# Patient Record
Sex: Male | Born: 1935 | Race: White | Hispanic: No | Smoking: Former smoker
Health system: Southern US, Community
[De-identification: ages and names within clinical notes are randomized; demographics above are authoritative.]

## PROBLEM LIST (undated history)

## (undated) DIAGNOSIS — E785 Hyperlipidemia, unspecified: Secondary | ICD-10-CM

## (undated) DIAGNOSIS — J189 Pneumonia, unspecified organism: Secondary | ICD-10-CM

## (undated) DIAGNOSIS — M1990 Unspecified osteoarthritis, unspecified site: Secondary | ICD-10-CM

## (undated) DIAGNOSIS — M545 Low back pain, unspecified: Secondary | ICD-10-CM

## (undated) DIAGNOSIS — N400 Enlarged prostate without lower urinary tract symptoms: Secondary | ICD-10-CM

## (undated) DIAGNOSIS — Z860100 Personal history of colon polyps, unspecified: Secondary | ICD-10-CM

## (undated) DIAGNOSIS — K219 Gastro-esophageal reflux disease without esophagitis: Secondary | ICD-10-CM

## (undated) DIAGNOSIS — M353 Polymyalgia rheumatica: Secondary | ICD-10-CM

## (undated) DIAGNOSIS — I10 Essential (primary) hypertension: Secondary | ICD-10-CM

## (undated) DIAGNOSIS — I471 Supraventricular tachycardia, unspecified: Secondary | ICD-10-CM

## (undated) DIAGNOSIS — I739 Peripheral vascular disease, unspecified: Secondary | ICD-10-CM

## (undated) HISTORY — DX: Peripheral vascular disease, unspecified: I73.9

## (undated) HISTORY — PX: CHOLECYSTECTOMY: SHX55

## (undated) HISTORY — DX: Personal history of colonic polyps: Z86.010

## (undated) HISTORY — PX: HERNIA REPAIR: SHX51

## (undated) HISTORY — DX: Supraventricular tachycardia: I47.1

## (undated) HISTORY — DX: Gastro-esophageal reflux disease without esophagitis: K21.9

## (undated) HISTORY — DX: Supraventricular tachycardia, unspecified: I47.10

## (undated) HISTORY — DX: Personal history of colon polyps, unspecified: Z86.0100

## (undated) HISTORY — DX: Benign prostatic hyperplasia without lower urinary tract symptoms: N40.0

## (undated) HISTORY — DX: Polymyalgia rheumatica: M35.3

## (undated) HISTORY — DX: Essential (primary) hypertension: I10

## (undated) HISTORY — DX: Hyperlipidemia, unspecified: E78.5

## (undated) HISTORY — DX: Low back pain: M54.5

## (undated) HISTORY — DX: Low back pain, unspecified: M54.50

---

## 1999-06-26 DIAGNOSIS — J189 Pneumonia, unspecified organism: Secondary | ICD-10-CM

## 1999-06-26 HISTORY — DX: Pneumonia, unspecified organism: J18.9

## 2004-04-07 ENCOUNTER — Encounter: Admission: RE | Admit: 2004-04-07 | Discharge: 2004-04-07 | Payer: Self-pay | Admitting: Family Medicine

## 2006-04-19 ENCOUNTER — Encounter: Admission: RE | Admit: 2006-04-19 | Discharge: 2006-07-18 | Payer: Self-pay | Admitting: Family Medicine

## 2006-07-30 ENCOUNTER — Ambulatory Visit: Payer: Self-pay | Admitting: Internal Medicine

## 2006-07-31 LAB — CONVERTED CEMR LAB
AST: 19 units/L (ref 0–37)
Albumin: 3.6 g/dL (ref 3.5–5.2)
Alkaline Phosphatase: 70 units/L (ref 39–117)
BUN: 14 mg/dL (ref 6–23)
Basophils Absolute: 0.1 10*3/uL (ref 0.0–0.1)
Bilirubin Urine: NEGATIVE
Bilirubin, Direct: 0.1 mg/dL (ref 0.0–0.3)
Chloride: 108 meq/L (ref 96–112)
Direct LDL: 146.7 mg/dL
GFR calc Af Amer: 85 mL/min
GFR calc non Af Amer: 70 mL/min
Glucose, Bld: 130 mg/dL — ABNORMAL HIGH (ref 70–99)
HCT: 43.7 % (ref 39.0–52.0)
HDL: 37.6 mg/dL — ABNORMAL LOW (ref >39.0)
Hemoglobin, Urine: NEGATIVE
Lymphocytes Relative: 22.1 % (ref 12.0–46.0)
Monocytes Relative: 6.9 % (ref 3.0–11.0)
Neutrophils Relative %: 65.1 % (ref 43.0–77.0)
Nitrite: NEGATIVE
Platelets: 312 10*3/uL (ref 150–400)
RDW: 13.7 % (ref 11.5–14.6)
Sodium: 142 meq/L (ref 135–145)
Specific Gravity, Urine: 1.025 (ref 1.000–1.03)
TSH: 1.05 microintl units/mL (ref 0.35–5.50)
Testosterone: 348.46 ng/dL — ABNORMAL LOW (ref 350.00–890)
Total Bilirubin: 1.1 mg/dL (ref 0.3–1.2)
Urine Glucose: NEGATIVE mg/dL
Urobilinogen, UA: 0.2 (ref 0.0–1.0)
pH: 6 (ref 5.0–8.0)

## 2006-11-06 ENCOUNTER — Ambulatory Visit: Payer: Self-pay | Admitting: Internal Medicine

## 2006-11-06 LAB — CONVERTED CEMR LAB
ALT: 23 units/L (ref 0–40)
Albumin: 3.6 g/dL (ref 3.5–5.2)
Bilirubin, Direct: 0.2 mg/dL (ref 0.0–0.3)
Cholesterol: 152 mg/dL (ref 0–200)
LDL Cholesterol: 90 mg/dL (ref 0–99)
Total Bilirubin: 1.2 mg/dL (ref 0.3–1.2)
Total Protein: 7 g/dL (ref 6.0–8.3)
VLDL: 30 mg/dL (ref 0–40)

## 2007-07-31 ENCOUNTER — Ambulatory Visit: Payer: Self-pay | Admitting: Internal Medicine

## 2007-07-31 DIAGNOSIS — M545 Low back pain: Secondary | ICD-10-CM

## 2007-07-31 DIAGNOSIS — I10 Essential (primary) hypertension: Secondary | ICD-10-CM | POA: Insufficient documentation

## 2007-07-31 DIAGNOSIS — K219 Gastro-esophageal reflux disease without esophagitis: Secondary | ICD-10-CM | POA: Insufficient documentation

## 2007-07-31 DIAGNOSIS — L989 Disorder of the skin and subcutaneous tissue, unspecified: Secondary | ICD-10-CM | POA: Insufficient documentation

## 2007-07-31 DIAGNOSIS — R002 Palpitations: Secondary | ICD-10-CM

## 2007-07-31 DIAGNOSIS — E785 Hyperlipidemia, unspecified: Secondary | ICD-10-CM

## 2007-08-01 ENCOUNTER — Encounter (INDEPENDENT_AMBULATORY_CARE_PROVIDER_SITE_OTHER): Payer: Self-pay | Admitting: *Deleted

## 2007-08-04 LAB — CONVERTED CEMR LAB
ALT: 32 units/L (ref 0–53)
ALT: 32 units/L (ref 0–53)
AST: 26 units/L (ref 0–37)
Albumin: 4 g/dL (ref 3.5–5.2)
Alkaline Phosphatase: 70 units/L (ref 39–117)
BUN: 13 mg/dL (ref 6–23)
Bilirubin Urine: NEGATIVE
Bilirubin, Direct: 0.3 mg/dL (ref 0.0–0.3)
CO2: 29 meq/L (ref 19–32)
CO2: 29 meq/L (ref 19–32)
Calcium: 9.5 mg/dL (ref 8.4–10.5)
Cholesterol: 184 mg/dL (ref 0–200)
Creatinine, Ser: 1.1 mg/dL (ref 0.4–1.5)
Eosinophils Relative: 1.8 % (ref 0.0–5.0)
GFR calc Af Amer: 85 mL/min
GFR calc Af Amer: 85 mL/min
GFR calc non Af Amer: 70 mL/min
Glucose, Bld: 147 mg/dL — ABNORMAL HIGH (ref 70–99)
Glucose, Bld: 147 mg/dL — ABNORMAL HIGH (ref 70–99)
HCT: 45.1 % (ref 39.0–52.0)
Hemoglobin: 15 g/dL (ref 13.0–17.0)
Hemoglobin: 15 g/dL (ref 13.0–17.0)
Hgb A1c MFr Bld: 6.6 % — ABNORMAL HIGH (ref 4.6–6.0)
Ketones, ur: NEGATIVE mg/dL
LDL Cholesterol: 113 mg/dL — ABNORMAL HIGH (ref 0–99)
LDL Cholesterol: 113 mg/dL — ABNORMAL HIGH (ref 0–99)
Lymphocytes Relative: 13.8 % (ref 12.0–46.0)
MCHC: 33.1 g/dL (ref 30.0–36.0)
MCHC: 33.1 g/dL (ref 30.0–36.0)
MCV: 95.9 fL (ref 78.0–100.0)
Monocytes Absolute: 0.7 10*3/uL (ref 0.2–0.7)
Monocytes Relative: 5.7 % (ref 3.0–11.0)
Monocytes Relative: 5.7 % (ref 3.0–11.0)
Neutro Abs: 9.9 10*3/uL — ABNORMAL HIGH (ref 1.4–7.7)
PSA: 1.63 ng/mL (ref 0.10–4.00)
RBC: 4.71 M/uL (ref 4.22–5.81)
Sodium: 140 meq/L (ref 135–145)
Sodium: 140 meq/L (ref 135–145)
Specific Gravity, Urine: 1.025 (ref 1.000–1.03)
Specific Gravity, Urine: 1.025 (ref 1.000–1.03)
TSH: 0.72 microintl units/mL (ref 0.35–5.50)
TSH: 0.72 microintl units/mL (ref 0.35–5.50)
Total Bilirubin: 1.8 mg/dL — ABNORMAL HIGH (ref 0.3–1.2)
Total CHOL/HDL Ratio: 4.4
Total Protein, Urine: NEGATIVE mg/dL
Total Protein: 7.5 g/dL (ref 6.0–8.3)
Total Protein: 7.5 g/dL (ref 6.0–8.3)
Triglycerides: 145 mg/dL (ref 0–149)
Triglycerides: 145 mg/dL (ref 0–149)
Urobilinogen, UA: 0.2 (ref 0.0–1.0)
VLDL: 29 mg/dL (ref 0–40)
WBC: 12.8 10*3/uL — ABNORMAL HIGH (ref 4.5–10.5)
pH: 5 (ref 5.0–8.0)

## 2007-08-06 ENCOUNTER — Telehealth (INDEPENDENT_AMBULATORY_CARE_PROVIDER_SITE_OTHER): Payer: Self-pay | Admitting: *Deleted

## 2007-08-12 ENCOUNTER — Telehealth (INDEPENDENT_AMBULATORY_CARE_PROVIDER_SITE_OTHER): Payer: Self-pay | Admitting: *Deleted

## 2007-08-19 ENCOUNTER — Ambulatory Visit: Payer: Self-pay

## 2007-08-19 ENCOUNTER — Ambulatory Visit: Payer: Self-pay | Admitting: Cardiology

## 2007-08-19 ENCOUNTER — Encounter: Payer: Self-pay | Admitting: Internal Medicine

## 2007-08-20 ENCOUNTER — Ambulatory Visit: Payer: Self-pay | Admitting: Internal Medicine

## 2007-08-20 DIAGNOSIS — I739 Peripheral vascular disease, unspecified: Secondary | ICD-10-CM | POA: Insufficient documentation

## 2007-08-20 DIAGNOSIS — E119 Type 2 diabetes mellitus without complications: Secondary | ICD-10-CM

## 2007-08-26 ENCOUNTER — Telehealth (INDEPENDENT_AMBULATORY_CARE_PROVIDER_SITE_OTHER): Payer: Self-pay | Admitting: *Deleted

## 2007-08-27 ENCOUNTER — Telehealth (INDEPENDENT_AMBULATORY_CARE_PROVIDER_SITE_OTHER): Payer: Self-pay | Admitting: *Deleted

## 2007-08-28 ENCOUNTER — Ambulatory Visit: Payer: Self-pay | Admitting: Internal Medicine

## 2007-08-28 ENCOUNTER — Telehealth (INDEPENDENT_AMBULATORY_CARE_PROVIDER_SITE_OTHER): Payer: Self-pay | Admitting: *Deleted

## 2007-09-03 ENCOUNTER — Telehealth (INDEPENDENT_AMBULATORY_CARE_PROVIDER_SITE_OTHER): Payer: Self-pay | Admitting: *Deleted

## 2007-09-10 ENCOUNTER — Ambulatory Visit: Payer: Self-pay

## 2007-09-10 ENCOUNTER — Ambulatory Visit: Payer: Self-pay | Admitting: Cardiovascular Disease

## 2007-09-22 ENCOUNTER — Ambulatory Visit: Payer: Self-pay | Admitting: Internal Medicine

## 2007-09-22 DIAGNOSIS — IMO0001 Reserved for inherently not codable concepts without codable children: Secondary | ICD-10-CM | POA: Insufficient documentation

## 2007-09-22 DIAGNOSIS — R079 Chest pain, unspecified: Secondary | ICD-10-CM

## 2007-09-23 LAB — CONVERTED CEMR LAB
ALT: 16 U/L
AST: 17 U/L
Albumin: 3.6 g/dL
Alkaline Phosphatase: 67 U/L
BUN: 19 mg/dL
Bilirubin, Direct: 0.3 mg/dL
CO2: 30 meq/L
Calcium: 9.4 mg/dL
Chloride: 108 meq/L
Cholesterol: 142 mg/dL
Creatinine, Ser: 1 mg/dL
GFR calc Af Amer: 94 mL/min
GFR calc non Af Amer: 78 mL/min
Glucose, Bld: 132 mg/dL — ABNORMAL HIGH
HDL: 34.4 mg/dL — ABNORMAL LOW
Hgb A1c MFr Bld: 6.4 % — ABNORMAL HIGH
LDL Cholesterol: 86 mg/dL
Potassium: 4.4 meq/L
Sodium: 143 meq/L
Total Bilirubin: 1.9 mg/dL — ABNORMAL HIGH
Total CHOL/HDL Ratio: 4.1
Total Protein: 7.1 g/dL
Triglycerides: 108 mg/dL
VLDL: 22 mg/dL

## 2007-09-30 ENCOUNTER — Telehealth (INDEPENDENT_AMBULATORY_CARE_PROVIDER_SITE_OTHER): Payer: Self-pay | Admitting: *Deleted

## 2007-10-06 ENCOUNTER — Ambulatory Visit: Payer: Self-pay | Admitting: Cardiology

## 2007-10-23 ENCOUNTER — Ambulatory Visit: Payer: Self-pay | Admitting: Internal Medicine

## 2007-10-23 DIAGNOSIS — M2550 Pain in unspecified joint: Secondary | ICD-10-CM | POA: Insufficient documentation

## 2007-10-27 ENCOUNTER — Encounter (INDEPENDENT_AMBULATORY_CARE_PROVIDER_SITE_OTHER): Payer: Self-pay | Admitting: *Deleted

## 2007-10-27 ENCOUNTER — Telehealth (INDEPENDENT_AMBULATORY_CARE_PROVIDER_SITE_OTHER): Payer: Self-pay | Admitting: *Deleted

## 2007-10-29 ENCOUNTER — Telehealth: Payer: Self-pay | Admitting: Internal Medicine

## 2007-11-03 ENCOUNTER — Telehealth (INDEPENDENT_AMBULATORY_CARE_PROVIDER_SITE_OTHER): Payer: Self-pay | Admitting: *Deleted

## 2007-11-07 ENCOUNTER — Telehealth: Payer: Self-pay | Admitting: Internal Medicine

## 2007-12-22 ENCOUNTER — Telehealth (INDEPENDENT_AMBULATORY_CARE_PROVIDER_SITE_OTHER): Payer: Self-pay | Admitting: *Deleted

## 2007-12-22 ENCOUNTER — Encounter: Payer: Self-pay | Admitting: Internal Medicine

## 2007-12-30 ENCOUNTER — Telehealth (INDEPENDENT_AMBULATORY_CARE_PROVIDER_SITE_OTHER): Payer: Self-pay | Admitting: *Deleted

## 2008-01-23 ENCOUNTER — Telehealth: Payer: Self-pay | Admitting: Internal Medicine

## 2008-04-12 ENCOUNTER — Ambulatory Visit: Payer: Self-pay | Admitting: Internal Medicine

## 2008-04-12 DIAGNOSIS — N400 Enlarged prostate without lower urinary tract symptoms: Secondary | ICD-10-CM

## 2008-04-13 ENCOUNTER — Telehealth (INDEPENDENT_AMBULATORY_CARE_PROVIDER_SITE_OTHER): Payer: Self-pay | Admitting: *Deleted

## 2008-04-15 ENCOUNTER — Ambulatory Visit: Payer: Self-pay | Admitting: Internal Medicine

## 2008-04-15 LAB — CONVERTED CEMR LAB
AST: 21 units/L (ref 0–37)
Albumin: 3.4 g/dL — ABNORMAL LOW (ref 3.5–5.2)
Alkaline Phosphatase: 57 units/L (ref 39–117)
Basophils Absolute: 0 10*3/uL (ref 0.0–0.1)
Bilirubin, Direct: 0.1 mg/dL (ref 0.0–0.3)
Calcium: 9.2 mg/dL (ref 8.4–10.5)
Chloride: 107 meq/L (ref 96–112)
Eosinophils Relative: 2.3 % (ref 0.0–5.0)
GFR calc Af Amer: 84 mL/min
Glucose, Bld: 150 mg/dL — ABNORMAL HIGH (ref 70–99)
HCT: 40.2 % (ref 39.0–52.0)
Hgb A1c MFr Bld: 7.4 % — ABNORMAL HIGH (ref 4.6–6.0)
MCHC: 34 g/dL (ref 30.0–36.0)
MCV: 96.9 fL (ref 78.0–100.0)
Monocytes Absolute: 0.9 10*3/uL (ref 0.1–1.0)
Potassium: 3.9 meq/L (ref 3.5–5.1)
RBC: 4.15 M/uL — ABNORMAL LOW (ref 4.22–5.81)
Total Bilirubin: 0.9 mg/dL (ref 0.3–1.2)
Total CHOL/HDL Ratio: 4.1
VLDL: 33 mg/dL (ref 0–40)

## 2008-04-27 ENCOUNTER — Telehealth (INDEPENDENT_AMBULATORY_CARE_PROVIDER_SITE_OTHER): Payer: Self-pay | Admitting: *Deleted

## 2008-04-27 ENCOUNTER — Encounter (INDEPENDENT_AMBULATORY_CARE_PROVIDER_SITE_OTHER): Payer: Self-pay | Admitting: *Deleted

## 2008-05-12 ENCOUNTER — Telehealth (INDEPENDENT_AMBULATORY_CARE_PROVIDER_SITE_OTHER): Payer: Self-pay | Admitting: *Deleted

## 2008-05-24 ENCOUNTER — Encounter: Payer: Self-pay | Admitting: Internal Medicine

## 2008-06-25 HISTORY — PX: CARPAL TUNNEL RELEASE: SHX101

## 2008-07-05 ENCOUNTER — Ambulatory Visit: Payer: Self-pay | Admitting: Internal Medicine

## 2008-08-29 DIAGNOSIS — I70219 Atherosclerosis of native arteries of extremities with intermittent claudication, unspecified extremity: Secondary | ICD-10-CM | POA: Insufficient documentation

## 2008-08-30 ENCOUNTER — Ambulatory Visit: Payer: Self-pay

## 2008-08-30 ENCOUNTER — Encounter: Payer: Self-pay | Admitting: Cardiovascular Disease

## 2008-08-30 ENCOUNTER — Ambulatory Visit: Payer: Self-pay | Admitting: Cardiovascular Disease

## 2008-09-14 ENCOUNTER — Telehealth: Payer: Self-pay | Admitting: Internal Medicine

## 2008-09-20 ENCOUNTER — Encounter: Payer: Self-pay | Admitting: Internal Medicine

## 2008-10-04 ENCOUNTER — Ambulatory Visit: Payer: Self-pay | Admitting: Internal Medicine

## 2008-10-04 DIAGNOSIS — M353 Polymyalgia rheumatica: Secondary | ICD-10-CM | POA: Insufficient documentation

## 2008-11-08 ENCOUNTER — Ambulatory Visit: Payer: Self-pay | Admitting: Internal Medicine

## 2009-01-24 ENCOUNTER — Ambulatory Visit: Payer: Self-pay | Admitting: Internal Medicine

## 2009-02-09 ENCOUNTER — Telehealth: Payer: Self-pay | Admitting: Internal Medicine

## 2009-02-15 ENCOUNTER — Ambulatory Visit: Payer: Self-pay | Admitting: Family Medicine

## 2009-02-15 DIAGNOSIS — R209 Unspecified disturbances of skin sensation: Secondary | ICD-10-CM | POA: Insufficient documentation

## 2009-02-22 ENCOUNTER — Ambulatory Visit: Payer: Self-pay | Admitting: Internal Medicine

## 2009-02-22 DIAGNOSIS — G5600 Carpal tunnel syndrome, unspecified upper limb: Secondary | ICD-10-CM | POA: Insufficient documentation

## 2009-02-24 ENCOUNTER — Telehealth: Payer: Self-pay | Admitting: Internal Medicine

## 2009-03-14 ENCOUNTER — Encounter: Payer: Self-pay | Admitting: Internal Medicine

## 2009-03-14 ENCOUNTER — Telehealth: Payer: Self-pay | Admitting: Internal Medicine

## 2009-03-16 ENCOUNTER — Telehealth (INDEPENDENT_AMBULATORY_CARE_PROVIDER_SITE_OTHER): Payer: Self-pay | Admitting: *Deleted

## 2009-03-22 ENCOUNTER — Telehealth: Payer: Self-pay | Admitting: Internal Medicine

## 2009-03-25 ENCOUNTER — Encounter: Payer: Self-pay | Admitting: Internal Medicine

## 2009-04-13 ENCOUNTER — Encounter (INDEPENDENT_AMBULATORY_CARE_PROVIDER_SITE_OTHER): Payer: Self-pay | Admitting: *Deleted

## 2009-04-25 ENCOUNTER — Encounter: Admission: RE | Admit: 2009-04-25 | Discharge: 2009-04-25 | Payer: Self-pay | Admitting: Orthopaedic Surgery

## 2009-04-25 ENCOUNTER — Encounter: Payer: Self-pay | Admitting: Internal Medicine

## 2009-05-03 ENCOUNTER — Telehealth (INDEPENDENT_AMBULATORY_CARE_PROVIDER_SITE_OTHER): Payer: Self-pay | Admitting: *Deleted

## 2009-05-09 ENCOUNTER — Ambulatory Visit (HOSPITAL_BASED_OUTPATIENT_CLINIC_OR_DEPARTMENT_OTHER): Admission: RE | Admit: 2009-05-09 | Discharge: 2009-05-09 | Payer: Self-pay | Admitting: Orthopedic Surgery

## 2009-05-16 ENCOUNTER — Encounter (INDEPENDENT_AMBULATORY_CARE_PROVIDER_SITE_OTHER): Payer: Self-pay | Admitting: *Deleted

## 2009-05-30 ENCOUNTER — Ambulatory Visit (HOSPITAL_BASED_OUTPATIENT_CLINIC_OR_DEPARTMENT_OTHER): Admission: RE | Admit: 2009-05-30 | Discharge: 2009-05-30 | Payer: Self-pay | Admitting: Orthopedic Surgery

## 2009-06-06 ENCOUNTER — Encounter (INDEPENDENT_AMBULATORY_CARE_PROVIDER_SITE_OTHER): Payer: Self-pay | Admitting: *Deleted

## 2009-06-13 ENCOUNTER — Encounter (INDEPENDENT_AMBULATORY_CARE_PROVIDER_SITE_OTHER): Payer: Self-pay | Admitting: *Deleted

## 2009-06-25 HISTORY — PX: TOTAL HIP ARTHROPLASTY: SHX124

## 2009-07-11 ENCOUNTER — Encounter: Payer: Self-pay | Admitting: Internal Medicine

## 2009-08-02 ENCOUNTER — Ambulatory Visit: Payer: Self-pay | Admitting: Internal Medicine

## 2009-08-02 LAB — CONVERTED CEMR LAB
Basophils Absolute: 0 10*3/uL (ref 0.0–0.1)
CO2: 30 meq/L (ref 19–32)
Chloride: 111 meq/L (ref 96–112)
Cholesterol: 151 mg/dL (ref 0–200)
Glucose, Bld: 115 mg/dL — ABNORMAL HIGH (ref 70–99)
HDL: 47.4 mg/dL (ref >39.00)
Hemoglobin, Urine: NEGATIVE
Ketones, ur: NEGATIVE mg/dL
LDL Cholesterol: 84 mg/dL (ref 0–99)
Leukocytes, UA: NEGATIVE
Lymphs Abs: 1.9 10*3/uL (ref 0.7–4.0)
Nitrite: NEGATIVE
PSA: 1.75 ng/mL (ref 0.10–4.00)
Platelets: 289 10*3/uL (ref 150.0–400.0)
Potassium: 4.3 meq/L (ref 3.5–5.1)
RDW: 13.3 % (ref 11.5–14.6)
Sodium: 144 meq/L (ref 135–145)
Specific Gravity, Urine: >=1.03 (ref 1.000–1.030)
Total Bilirubin: 1.3 mg/dL — ABNORMAL HIGH (ref 0.3–1.2)
Total CHOL/HDL Ratio: 3
Total Protein: 6.6 g/dL (ref 6.0–8.3)
Triglycerides: 97 mg/dL (ref 0.0–149.0)
WBC: 9.1 10*3/uL (ref 4.5–10.5)
pH: 5 (ref 5.0–8.0)

## 2009-08-11 ENCOUNTER — Encounter: Payer: Self-pay | Admitting: Internal Medicine

## 2009-08-22 ENCOUNTER — Ambulatory Visit: Payer: Self-pay | Admitting: Internal Medicine

## 2009-08-22 DIAGNOSIS — Z860100 Personal history of colon polyps, unspecified: Secondary | ICD-10-CM | POA: Insufficient documentation

## 2009-10-04 ENCOUNTER — Encounter: Payer: Self-pay | Admitting: Internal Medicine

## 2009-10-11 ENCOUNTER — Ambulatory Visit: Payer: Self-pay | Admitting: Cardiovascular Disease

## 2010-01-16 ENCOUNTER — Inpatient Hospital Stay (HOSPITAL_COMMUNITY): Admission: RE | Admit: 2010-01-16 | Discharge: 2010-01-19 | Payer: Self-pay | Admitting: Orthopaedic Surgery

## 2010-01-16 ENCOUNTER — Encounter: Payer: Self-pay | Admitting: Internal Medicine

## 2010-01-17 ENCOUNTER — Encounter: Payer: Self-pay | Admitting: Internal Medicine

## 2010-01-23 ENCOUNTER — Ambulatory Visit: Payer: Self-pay | Admitting: Internal Medicine

## 2010-01-23 DIAGNOSIS — E8779 Other fluid overload: Secondary | ICD-10-CM | POA: Insufficient documentation

## 2010-01-26 ENCOUNTER — Ambulatory Visit: Payer: Self-pay | Admitting: Internal Medicine

## 2010-01-26 LAB — CONVERTED CEMR LAB
Basophils Absolute: 0.1 10*3/uL (ref 0.0–0.1)
Basophils Relative: 0.6 % (ref 0.0–3.0)
Creatinine, Ser: 1.2 mg/dL (ref 0.4–1.5)
Eosinophils Relative: 3.4 % (ref 0.0–5.0)
GFR calc non Af Amer: 62.06 mL/min (ref >60)
Glucose, Bld: 152 mg/dL — ABNORMAL HIGH (ref 70–99)
HCT: 30.4 % — ABNORMAL LOW (ref 39.0–52.0)
Hemoglobin: 10.2 g/dL — ABNORMAL LOW (ref 13.0–17.0)
Lymphs Abs: 1.5 10*3/uL (ref 0.7–4.0)
MCHC: 33.6 g/dL (ref 30.0–36.0)
Monocytes Absolute: 0.8 10*3/uL (ref 0.1–1.0)
Platelets: 521 10*3/uL — ABNORMAL HIGH (ref 150.0–400.0)
Pro B Natriuretic peptide (BNP): 159.1 pg/mL — ABNORMAL HIGH (ref 0.0–100.0)
RBC: 3.11 M/uL — ABNORMAL LOW (ref 4.22–5.81)
Sodium: 138 meq/L (ref 135–145)
WBC: 13.7 10*3/uL — ABNORMAL HIGH (ref 4.5–10.5)

## 2010-01-30 ENCOUNTER — Telehealth: Payer: Self-pay | Admitting: Family Medicine

## 2010-02-06 ENCOUNTER — Telehealth: Payer: Self-pay | Admitting: Internal Medicine

## 2010-02-13 ENCOUNTER — Telehealth: Payer: Self-pay | Admitting: Internal Medicine

## 2010-02-17 ENCOUNTER — Ambulatory Visit: Payer: Self-pay | Admitting: Internal Medicine

## 2010-02-20 ENCOUNTER — Telehealth: Payer: Self-pay | Admitting: Internal Medicine

## 2010-02-24 ENCOUNTER — Encounter: Payer: Self-pay | Admitting: Internal Medicine

## 2010-03-01 ENCOUNTER — Encounter: Payer: Self-pay | Admitting: Internal Medicine

## 2010-03-13 ENCOUNTER — Telehealth: Payer: Self-pay | Admitting: Internal Medicine

## 2010-06-21 ENCOUNTER — Ambulatory Visit
Admission: RE | Admit: 2010-06-21 | Discharge: 2010-06-21 | Payer: Self-pay | Source: Home / Self Care | Attending: Internal Medicine | Admitting: Internal Medicine

## 2010-06-21 ENCOUNTER — Emergency Department (HOSPITAL_COMMUNITY)
Admission: EM | Admit: 2010-06-21 | Discharge: 2010-06-21 | Payer: Self-pay | Source: Home / Self Care | Admitting: Emergency Medicine

## 2010-06-21 ENCOUNTER — Encounter: Payer: Self-pay | Admitting: Internal Medicine

## 2010-06-27 ENCOUNTER — Ambulatory Visit (HOSPITAL_COMMUNITY): Admission: RE | Admit: 2010-06-27 | Payer: Self-pay | Source: Home / Self Care | Admitting: Internal Medicine

## 2010-07-05 ENCOUNTER — Ambulatory Visit (HOSPITAL_COMMUNITY)
Admission: RE | Admit: 2010-07-05 | Discharge: 2010-07-05 | Payer: Self-pay | Source: Home / Self Care | Attending: Internal Medicine | Admitting: Internal Medicine

## 2010-07-05 ENCOUNTER — Ambulatory Visit
Admission: RE | Admit: 2010-07-05 | Discharge: 2010-07-05 | Payer: Self-pay | Source: Home / Self Care | Attending: Internal Medicine | Admitting: Internal Medicine

## 2010-07-05 ENCOUNTER — Other Ambulatory Visit: Payer: Self-pay | Admitting: Internal Medicine

## 2010-07-05 ENCOUNTER — Encounter: Payer: Self-pay | Admitting: Internal Medicine

## 2010-07-05 ENCOUNTER — Ambulatory Visit: Admission: RE | Admit: 2010-07-05 | Discharge: 2010-07-05 | Payer: Self-pay | Source: Home / Self Care

## 2010-07-05 DIAGNOSIS — I471 Supraventricular tachycardia, unspecified: Secondary | ICD-10-CM | POA: Insufficient documentation

## 2010-07-05 LAB — TSH: TSH: 0.83 u[IU]/mL (ref 0.35–5.50)

## 2010-07-05 LAB — MAGNESIUM: Magnesium: 2 mg/dL (ref 1.5–2.5)

## 2010-07-14 ENCOUNTER — Ambulatory Visit
Admission: RE | Admit: 2010-07-14 | Discharge: 2010-07-14 | Payer: Self-pay | Source: Home / Self Care | Attending: Internal Medicine | Admitting: Internal Medicine

## 2010-07-14 DIAGNOSIS — M79609 Pain in unspecified limb: Secondary | ICD-10-CM | POA: Insufficient documentation

## 2010-07-21 ENCOUNTER — Telehealth: Payer: Self-pay | Admitting: Internal Medicine

## 2010-07-25 NOTE — Assessment & Plan Note (Signed)
Summary: 3 day follow up fluid retention/cjr   Vital Signs:  Patient profile:   77 year old male Weight:      176 pounds Temp:     97.9 degrees F oral BP sitting:   120 / 70  (right arm) Cuff size:   regular  Vitals Entered By: Duard Brady LPN (January 26, 2010 8:00 AM) CC: f/u - doing better Is Patient Diabetic? No   Primary Care Provider:  Amador Cunas  CC:  f/u - doing better.  History of Present Illness: 77 year old patient who is seen today for follow-up.  He was felt to have fluid overload and was seen 3 days ago and placed on furosemide 40 mg daily.  He lost 10 pounds within the first 24 hours, but then his weight has stabilized.  His shortness of breath is much improved and he is making nice progress with physical therapy.  Laboratory studies were ordered.  Three days ago, but apparently not performed.  He has hypertension and diabetes.  His amlodipine has been discontinued due to his peripheral edema and adequate blood pressure control.  He denies any chest pain, PND, orthopnea.  His left leg swelling has resolved and  his right leg edema has improved  Allergies: 1)  ! Penicillin  Review of Systems       The patient complains of dyspnea on exertion and peripheral edema.  The patient denies anorexia, fever, weight loss, weight gain, vision loss, decreased hearing, hoarseness, chest pain, syncope, prolonged cough, headaches, hemoptysis, abdominal pain, melena, hematochezia, severe indigestion/heartburn, hematuria, incontinence, genital sores, muscle weakness, suspicious skin lesions, transient blindness, difficulty walking, depression, unusual weight change, abnormal bleeding, enlarged lymph nodes, angioedema, breast masses, and testicular masses.         his dyspnea on exertion, has largely resolved  Physical Exam  General:  Well-developed,well-nourished,in no acute distress; alert,appropriate and cooperative throughout examination; 110/70 Neck:  no neck vein  distention sitting Lungs:  auscultation lungs basically unchanged.  Still has considerable rales of all the lower half lung fields bilaterally.  O2 saturation 93% Heart:  Normal rate and regular rhythm. S1 and S2 normal without gallop, murmur, click, rub or other extra sounds. Extremities:  1+ right pedal edema.  1+ right pedal edema.     Impression & Recommendations:  Problem # 1:  OTHER FLUID OVERLOAD (ICD-276.69)  Problem # 2:  ESSENTIAL HYPERTENSION (ICD-401.9)  The following medications were removed from the medication list:    Amlodipine Besylate 5 Mg Tabs (Amlodipine besylate) .Marland Kitchen... Take one tablet by mouth daily His updated medication list for this problem includes:    Benazepril Hcl 40 Mg Tabs (Benazepril hcl) .Marland Kitchen... 1 by mouth qd    Furosemide 40 Mg Tabs (Furosemide) ..... One tablet  twice daily    The following medications were removed from the medication list:    Amlodipine Besylate 5 Mg Tabs (Amlodipine besylate) .Marland Kitchen... Take one tablet by mouth daily His updated medication list for this problem includes:    Benazepril Hcl 40 Mg Tabs (Benazepril hcl) .Marland Kitchen... 1 by mouth qd    Furosemide 40 Mg Tabs (Furosemide) ..... One tablet  twice daily  Orders: Venipuncture (04540) TLB-CBC Platelet - w/Differential (85025-CBCD) TLB-BMP (Basic Metabolic Panel-BMET) (80048-METABOL) TLB-BNP (B-Natriuretic Peptide) (83880-BNPR)  Complete Medication List: 1)  Simvastatin 40 Mg Tabs (Simvastatin) .... Take 1 tablet by mouth once a day 2)  Adult Aspirin Ec Low Strength 81 Mg Tbec (Aspirin) .... Take 1 by mouth qd 3)  Benazepril Hcl 40 Mg Tabs (Benazepril hcl) .Marland Kitchen.. 1 by mouth qd 4)  Omeprazole 20 Mg Cpdr (Omeprazole) .Marland Kitchen.. 1 by mouth qd 5)  Advil Pm 200-25 Mg Caps (Ibuprofen-diphenhydramine hcl) .... 2 tabs at bedtime 6)  Selenium 200 Mcg Tabs (Selenium) .... Take 1 tablet by mouth once a day 7)  Potassium 99 Mg Tabs (Potassium) .... Take 2  tablet by mouth  twice daily 8)  Super B Complex  Tabs (B complex-c) .... Take 1 tablet by mouth once a day 9)  Cvs Vitamin E 400 Unit Caps (Vitamin e) .... Take 1 capsule by mouth once a day 10)  Vitamin C 500mg  Tab  .... Take 1 tablet by mouth once a day 11)  Fiber Laxative 0.52 Gm Caps (Psyllium) .... Take 1 capsule by mouth once a day 12)  Multivitamins Tabs (Multiple vitamin) .... Once daily 13)  Ibuprofen 200 Mg Caps (Ibuprofen) .... As needed 14)  Abx From Hip Replacement  .... Bid 15)  Pain Med From Hip Replacement  16)  Muscle Relaxer From Hip Replacement  17)  Laxative  .... Prn 18)  Coumadin  .... Qd 19)  Furosemide 40 Mg Tabs (Furosemide) .... One tablet  twice daily  Patient Instructions: 1)  Limit your Sodium (Salt). 2)  Weight daily  3)  Call if worsening shortness of breath  4)  Please schedule a follow-up appointment in 2 weeks. Prescriptions: FUROSEMIDE 40 MG TABS (FUROSEMIDE) one tablet  twice daily  #180 x 2   Entered and Authorized by:   Gordy Savers  MD   Signed by:   Gordy Savers  MD on 01/26/2010   Method used:   [facility]ronically to        [facility] [phone][facility] (retail)       [address]       [place], [place]  [address]       Ph: [phone] or [phone]       Fax: [phone]   RxID:   [id number]   Appended Document: 3 day follow up fluid retention/cjr     Allergies: 1)  ! Penicillin   Complete Medication List: 1)  Simvastatin 40 Mg Tabs (Simvastatin) .... Take 1 tablet by mouth once a day 2)  Adult Aspirin Ec Low Strength 81 Mg Tbec (Aspirin) .... Take 1 by mouth qd 3)  Benazepril Hcl 40 Mg Tabs (Benazepril hcl) .Marland Kitchen.. 1 by mouth qd 4)  Omeprazole 20 Mg Cpdr (Omeprazole) .Marland Kitchen.. 1 by mouth qd 5)  Advil Pm 200-25 Mg Caps (Ibuprofen-diphenhydramine hcl) .... 2 tabs at bedtime 6)  Selenium 200 Mcg Tabs (Selenium) .... Take 1 tablet by mouth once a day 7)  Potassium 99 Mg Tabs (Potassium) .... Take 2  tablet by mouth  twice daily 8)  Super B Complex Tabs (B complex-c) .... Take 1  tablet by mouth once a day 9)  Cvs Vitamin E 400 Unit Caps (Vitamin e) .... Take 1 capsule by mouth once a day 10)  Vitamin C 500mg  Tab  .... Take 1 tablet by mouth once a day 11)  Fiber Laxative 0.52 Gm Caps (Psyllium) .... Take 1 capsule by mouth once a day 12)  Multivitamins Tabs (Multiple vitamin) .... Once daily 13)  Ibuprofen 200 Mg Caps (Ibuprofen) .... As needed 14)  Pain Med From Hip Replacement  15)  Muscle Relaxer From Hip Replacement  16)  Laxative  .... Prn 17)  Coumadin  .... Qd 18)  Furosemide 40 Mg  Tabs (Furosemide) .... One tablet  twice daily 19)  Clindamycin Hcl 300 Mg Caps (Clindamycin hcl) .... 2 by mouth bid

## 2010-07-25 NOTE — Miscellaneous (Signed)
Summary[facility]  [facility]   Imported By: Maryln Gottron 03/06/2010 14:21:14  _____________________________________________________________________  External Attachment:    Type:   Image     Comment:   External Document

## 2010-07-25 NOTE — Assessment & Plan Note (Signed)
Summary: O2 sats with exercise as low as 80/will fax all reports from ...   Vital Signs:  Patient profile:   77 year old male Weight:      186 pounds O2 Sat:      93 % on Room air Temp:     98.3 degrees F oral BP sitting:   130 / 74  (right arm) Cuff size:   regular  Vitals Entered By: Duard Brady LPN (January 23, 2010 3:33 PM)  O2 Flow:  Room air CC: sob with exercise - had hip relpacement (R) last week  Is Patient Diabetic? No   Primary Care Provider:  Amador Cunas  CC:  sob with exercise - had hip relpacement (R) last week .  History of Present Illness: 77 year old patient who is status post right hip surgery on July 25.  A medical consultation was performed on the first postop day due to shortness of breath and bilateral infiltrates.  He is now on an unknown antibiotic.  He remains on postop Coumadin.  A CT and Noreene Larsson was done on 726 that revealed extensive bilateral lower lobe infiltrates.  There is no evidence of a PE.  He denies any cough, but has been noted to desaturate with activity.  Denies any exertional chest pain, history of hypertension, which includes amlodipine, denies any PND, or orthopnea. He has treated hypertension and diabetes, which have been well controlled  Allergies: 1)  ! Penicillin  Review of Systems       The patient complains of dyspnea on exertion and peripheral edema.  The patient denies anorexia, fever, weight loss, weight gain, vision loss, decreased hearing, hoarseness, chest pain, syncope, prolonged cough, headaches, hemoptysis, abdominal pain, melena, hematochezia, severe indigestion/heartburn, hematuria, incontinence, genital sores, muscle weakness, suspicious skin lesions, transient blindness, difficulty walking, depression, unusual weight change, abnormal bleeding, enlarged lymph nodes, angioedema, breast masses, and testicular masses.    Physical Exam  General:  Well-developed,well-nourished,in no acute distress; alert,appropriate and  cooperative throughout examination Head:  Normocephalic and atraumatic without obvious abnormalities. No apparent alopecia or balding. Eyes:  No corneal or conjunctival inflammation noted. EOMI. Perrla. Funduscopic exam benign, without hemorrhages, exudates or papilledema. Vision grossly normal. Mouth:  Oral mucosa and oropharynx without lesions or exudates.  Teeth in good repair. Neck:  No deformities, masses, or tenderness noted. Lungs:  extensive rales lower one half lung fields O2 saturation 94% Heart:  Normal rate and regular rhythm. S1 and S2 normal without gallop, murmur, click, rub or other extra sounds. no tachycardia Abdomen:  Bowel sounds positive,abdomen soft and non-tender without masses, organomegaly or hernias noted. Msk:  No deformity or scoliosis noted of thoracic or lumbar spine.   Extremities:  1+ left pedal edema and 2+ right pedal edema.     Impression & Recommendations:  Problem # 1:  OTHER FLUID OVERLOAD (ICD-276.69)  Orders: TLB-BNP (B-Natriuretic Peptide) (83880-BNPR)  Problem # 2:  ESSENTIAL HYPERTENSION (ICD-401.9)  His updated medication list for this problem includes:    Benazepril Hcl 40 Mg Tabs (Benazepril hcl) .Marland Kitchen... 1 by mouth qd    Amlodipine Besylate 5 Mg Tabs (Amlodipine besylate) .Marland Kitchen... Take one tablet by mouth daily    Furosemide 40 Mg Tabs (Furosemide) ..... One tablet daily  Orders: Venipuncture ([address]) TLB-BMP (Basic Metabolic Panel-BMET) (80048-METABOL) TLB-CBC Platelet - w/Differential (85025-CBCD) TLB-BNP (B-Natriuretic Peptide) (83880-BNPR)  Problem # 3:  DIABETES MELLITUS, TYPE II (ICD-250.00)  His updated medication list for this problem includes:    Adult Aspirin Ec Low Strength  81 Mg Tbec (Aspirin) .Marland Kitchen... Take 1 by mouth qd    Benazepril Hcl 40 Mg Tabs (Benazepril hcl) .Marland Kitchen... 1 by mouth qd  Complete Medication List: 1)  Simvastatin 40 Mg Tabs (Simvastatin) .... Take 1 tablet by mouth once a day 2)  Adult Aspirin Ec Low Strength 81  Mg Tbec (Aspirin) .... Take 1 by mouth qd 3)  Benazepril Hcl 40 Mg Tabs (Benazepril hcl) .Marland Kitchen.. 1 by mouth qd 4)  Omeprazole 20 Mg Cpdr (Omeprazole) .Marland Kitchen.. 1 by mouth qd 5)  Advil Pm 200-25 Mg Caps (Ibuprofen-diphenhydramine hcl) .... 2 tabs at bedtime 6)  Selenium 200 Mcg Tabs (Selenium) .... Take 1 tablet by mouth once a day 7)  Potassium 99 Mg Tabs (Potassium) .... Take 1 tablet by mouth once a day 8)  Super B Complex Tabs (B complex-c) .... Take 1 tablet by mouth once a day 9)  Cvs Vitamin E 400 Unit Caps (Vitamin e) .... Take 1 capsule by mouth once a day 10)  Vitamin C 500mg  Tab  .... Take 1 tablet by mouth once a day 11)  Fiber Laxative 0.52 Gm Caps (Psyllium) .... Take 1 capsule by mouth once a day 12)  Multivitamins Tabs (Multiple vitamin) .... Once daily 13)  Amlodipine Besylate 5 Mg Tabs (Amlodipine besylate) .... Take one tablet by mouth daily 14)  Ibuprofen 200 Mg Caps (Ibuprofen) .... As needed 15)  Abx From Hip Replacement  .... Bid 16)  Pain Med From Hip Replacement  17)  Muscle Relaxer From Hip Replacement  18)  Laxative  .... Prn 19)  Coumadin  .... Qd 20)  Furosemide 40 Mg Tabs (Furosemide) .... One tablet daily  Patient Instructions: 1)  Please schedule a follow-up appointment in 3 days 2)  report to the hospital, if you develop any worsening shortness of breath 3)  furosemide 40 mg daily 4)  hold amlodipine Prescriptions: FUROSEMIDE 40 MG TABS (FUROSEMIDE) one tablet daily  #50 x 0   Entered and Authorized by:   Gordy Savers  MD   Signed by:   Gordy Savers  MD on 01/23/2010   Method used:   [facility]        [facility] [phone][facility] (retail)       [address]       [place], [place]  [address]       Ph: [phone] or [phone]       Fax: [phone]   RxID:   [id number]

## 2010-07-25 NOTE — Assessment & Plan Note (Signed)
Summary: cpx/cjr   Vital Signs:  Patient profile:   77 year old male Height:      67.75 inches Weight:      170 pounds BMI:     26.13 Temp:     97.7 degrees F oral BP sitting:   126 / 60  (left arm) Cuff size:   regular  Vitals Entered By: Duard Brady LPN (August 22, 2009 8:44 AM) CC: cpx - doing well, labs done Is Patient Diabetic? No   Primary Care Provider:  Amador Cunas  CC:  cpx - doing well and labs done.  History of Present Illness: 77 -year-old patient seen today for a comprehensive evaluation.  medical problems include BPH, history of polymyalgia rheumatica, impaired glucose tolerance, hypertension, and dyslipidemia.  He has a history PA D., which has been stable.  He has osteoarthritis with symptomatic right hip pain.  He is followed by a number of consultants.  Allergies: 1)  ! Penicillin  Past History:  Past Medical History: E.D. GERD Hyperlipidemia arrythmia 2006 Low back pain lumbar disc disease ([place]) hx of alcohol abuse Peripheral vascular disease Diabetes mellitus, type II - diet Hypertension Benign prostatic hypertrophy ( [place]) PMR  ([place]) carpal tunnel syndrome Colonic polyps, hx of RBBB   Past Surgical History: hernia x 2 Cholecystectomy Carpal tunnel (Bilateral) 2010 colonoscopy 2007  Family History: Reviewed history from 08/29/2008 and no changes required. Father - MI in his 66's No PAD or Stroke in the family  Social History: Reviewed history from 07/05/2008 and no changes required. 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  The patient denies anorexia, fever, weight loss, weight gain, vision loss, decreased hearing, hoarseness, chest pain, syncope, dyspnea on exertion, peripheral edema, prolonged  cough, headaches, hemoptysis, abdominal pain, melena, hematochezia, severe indigestion/heartburn, hematuria, incontinence, genital sores, muscle weakness, suspicious skin lesions, transient blindness, depression, unusual weight change, abnormal bleeding, enlarged lymph nodes, angioedema, breast masses, and testicular masses.    Physical Exam  General:  Well-developed,well-nourished,in no acute distress; alert,appropriate and cooperative throughout examination Head:  Normocephalic and atraumatic without obvious abnormalities. No apparent alopecia or balding. Eyes:  No corneal or conjunctival inflammation noted. EOMI. Perrla. Funduscopic exam benign, without hemorrhages, exudates or papilledema. Vision grossly normal. Ears:  External ear exam shows no significant lesions or deformities.  Otoscopic examination reveals clear canals, tympanic membranes are intact bilaterally without bulging, retraction, inflammation or discharge. Hearing is grossly normal bilaterally. Nose:  External nasal examination shows no deformity or inflammation. Nasal mucosa are pink and moist without lesions or exudates. Mouth:  Oral mucosa and oropharynx without lesions or exudates.  Teeth in good repair. Neck:  No deformities, masses, or tenderness noted. faint  left supraclavicular bruit Chest Wall:  No deformities, masses, tenderness or gynecomastia noted. Breasts:  No masses or gynecomastia noted Lungs:  bibasilar crackles Heart:  Normal rate and regular rhythm. S1 and S2 normal without gallop, murmur, click, rub or other extra sounds. Abdomen:  Bowel sounds positive,abdomen soft and non-tender without masses, organomegaly or hernias noted. right upper quadrant scar liver is palpable on inspiration only Rectal:  per  urology Genitalia:  Testes bilaterally descended without nodularity, tenderness or masses. No scrotal masses or lesions. No penis lesions or urethral discharge. Prostate:  per  urology Msk:  No deformity or  scoliosis noted of thoracic or lumbar spine.   Pulses:  pedal pulses absent on the left left femoral bruit noted Extremities:  No clubbing, cyanosis, edema, or deformity noted with normal full range of motion of all joints.   Neurologic:  No cranial nerve deficits noted. Station and gait are normal. Plantar reflexes are down-going bilaterally. DTRs are symmetrical throughout. Sensory, motor and coordinative functions appear intact. Skin:  Intact without suspicious lesions or rashes Cervical Nodes:  No lymphadenopathy noted Axillary Nodes:  No palpable lymphadenopathy Inguinal Nodes:  No significant adenopathy Psych:  Cognition and judgment appear intact. Alert and cooperative with normal attention span and concentration. No apparent delusions, illusions, hallucinations  Diabetes Management Exam:    Foot Exam (with socks and/or shoes not present):       Sensory-Pinprick/Light touch:          Left medial foot (L-4): diminished          Left dorsal foot (L-5): diminished          Left lateral foot (S-1): diminished          Right medial foot (L-4): diminished          Right dorsal foot (L-5): diminished          Right lateral foot (S-1): diminished       Sensory-Monofilament:          Left foot: diminished       Inspection:          Left foot: normal       Nails:          Left foot: normal          Right foot: normal    Foot Exam by Podiatrist:       Date: 08/22/2009       Results: early diabetic findings       Done by: PCP    Eye Exam:       Eye Exam done here today          Results: normal   Impression & Recommendations:  Problem # 1:  POLYMYALGIA RHEUMATICA (ICD-725)  Problem # 2:  HYPERTENSION (ICD-401.9)  His updated medication list for this problem includes:    Benazepril Hcl 40 Mg Tabs (Benazepril hcl) .Marland Kitchen... 1 by mouth qd    Amlodipine Besylate 5 Mg Tabs (Amlodipine besylate) .Marland Kitchen... Take one tablet by mouth daily  Orders: EKG w/ Interpretation (93000)  His updated  medication list for this problem includes:    Benazepril Hcl 40 Mg Tabs (Benazepril hcl) .Marland Kitchen... 1 by mouth qd    Amlodipine Besylate 5 Mg Tabs (Amlodipine besylate) .Marland Kitchen... Take one tablet by mouth daily  Problem # 3:  DIABETES MELLITUS, TYPE II (ICD-250.00)  His updated medication list for this problem includes:    Adult Aspirin Ec Low Strength 81 Mg Tbec (Aspirin) .Marland Kitchen... Take 1 by mouth qd    Benazepril Hcl 40 Mg Tabs (Benazepril hcl) .Marland Kitchen... 1 by mouth qd  His updated medication list for this problem includes:    Adult Aspirin Ec Low Strength 81 Mg Tbec (Aspirin) .Marland Kitchen... Take 1 by mouth qd    Benazepril Hcl 40 Mg Tabs (Benazepril hcl) .Marland Kitchen... 1 by mouth qd  Problem # 4:  ESSENTIAL HYPERTENSION (ICD-401.9)  His updated medication list for this problem includes:    Benazepril Hcl 40 Mg Tabs (Benazepril hcl) .Marland Kitchen... 1 by mouth qd    Amlodipine Besylate  5 Mg Tabs (Amlodipine besylate) .Marland Kitchen... Take one tablet by mouth daily  Orders: EKG w/ Interpretation (93000)  His updated medication list for this problem includes:    Benazepril Hcl 40 Mg Tabs (Benazepril hcl) .Marland Kitchen... 1 by mouth qd    Amlodipine Besylate 5 Mg Tabs (Amlodipine besylate) .Marland Kitchen... Take one tablet by mouth daily  Problem # 5:  HYPERLIPIDEMIA (ICD-272.4)  His updated medication list for this problem includes:    Simvastatin 40 Mg Tabs (Simvastatin) .Marland Kitchen... Take 1 tablet by mouth once a day  His updated medication list for this problem includes:    Simvastatin 40 Mg Tabs (Simvastatin) .Marland Kitchen... Take 1 tablet by mouth once a day  Complete Medication List: 1)  Simvastatin 40 Mg Tabs (Simvastatin) .... Take 1 tablet by mouth once a day 2)  Adult Aspirin Ec Low Strength 81 Mg Tbec (Aspirin) .... Take 1 by mouth qd 3)  Benazepril Hcl 40 Mg Tabs (Benazepril hcl) .Marland Kitchen.. 1 by mouth qd 4)  Omeprazole 20 Mg Cpdr (Omeprazole) .Marland Kitchen.. 1 by mouth qd 5)  Tylenol Extra Strength 500 Mg Tabs (Acetaminophen) .... 2 tabs by mouth qd 6)  Prednisone 5 Mg Tabs  (prednisone)  .... 1/2 po once daily 7)  Darvocet-n 100 100-650 Mg Tabs (Propoxyphene n-apap) .Marland Kitchen.. 1po q 6 hrs as needed pain 8)  Fish Oil 1000 Mg Caps (Omega-3 fatty acids) .... Take 1 capsule by mouth once a day 9)  Selenium 200 Mcg Tabs (Selenium) .... Take 1 tablet by mouth once a day 10)  Potassium 99 Mg Tabs (Potassium) .... Take 1 tablet by mouth once a day 11)  Super B Complex Tabs (B complex-c) .... Take 1 tablet by mouth once a day 12)  Cvs Vitamin E 400 Unit Caps (Vitamin e) .... Take 1 capsule by mouth once a day 13)  Vitamin C 500mg  Tab  .... Take 1 tablet by mouth once a day 14)  Fiber Laxative 0.52 Gm Caps (Psyllium) .... Take 1 capsule by mouth once a day 15)  Calcium 600mg  Tab  .... Take 1 tablet by mouth once a day 16)  Amlodipine Besylate 5 Mg Tabs (Amlodipine besylate) .... Take one tablet by mouth daily 17)  Pletal 100 Mg Tabs (Cilostazol) .... Take 1 tablet by mouth two times a day 18)  Tramadol Hcl 50 Mg Tabs (Tramadol hcl) .... One tablet every 6 hours for pain  Patient Instructions: 1)  Please schedule a follow-up appointment in 4 months. 2)  Limit your Sodium (Salt). 3)  It is important that you exercise regularly at least 20 minutes 5 times a week. If you develop chest pain, have severe difficulty breathing, or feel very tired , stop exercising immediately and seek medical attention. 4)  Check your blood sugars regularly. If your readings are usually above : or below 70 you should contact our office. 5)  It is important that your Diabetic A1c level is checked every 3 months. 6)  See your eye doctor yearly to check for diabetic eye damage. 7)  Take an Aspirin every day. Prescriptions: TRAMADOL HCL 50 MG TABS (TRAMADOL HCL) one tablet every 6 hours for pain  #50 x 6   Entered and Authorized by:   Gordy Savers  MD   Signed by:   Gordy Savers  MD on 08/22/2009   Method used:   Print then Give to Patient   RxID:   [id number] PLETAL 100 MG TABS  (CILOSTAZOL) Take 1 tablet by mouth two  times a day  #180 x 6   Entered and Authorized by:   Gordy Savers  MD   Signed by:   Gordy Savers  MD on 08/22/2009   Method used:   Print then Give to Patient   RxID:   [id number] AMLODIPINE BESYLATE 5 MG TABS (AMLODIPINE BESYLATE) Take one tablet by mouth daily  #90 Tablet x 6   Entered and Authorized by:   Gordy Savers  MD   Signed by:   Gordy Savers  MD on 08/22/2009   Method used:   Print then Give to Patient   RxID:   [id number] OMEPRAZOLE 20 MG  CPDR (OMEPRAZOLE) 1 by mouth qd  #90 x 10   Entered and Authorized by:   Gordy Savers  MD   Signed by:   Gordy Savers  MD on 08/22/2009   Method used:   Print then Give to Patient   RxID:   [id number] BENAZEPRIL HCL 40 MG  TABS (BENAZEPRIL HCL) 1 by mouth qd  #90 x 6   Entered and Authorized by:   Gordy Savers  MD   Signed by:   Gordy Savers  MD on 08/22/2009   Method used:   Print then Give to Patient   RxID:   [id number] SIMVASTATIN 40 MG TABS (SIMVASTATIN) Take 1 tablet by mouth once a day  #90 x 6   Entered and Authorized by:   Gordy Savers  MD   Signed by:   Gordy Savers  MD on 08/22/2009   Method used:   Print then Give to Patient   RxID:   [id number]

## 2010-07-25 NOTE — Letter (Signed)
Summary: [facility]   Imported By: Maryln Gottron 03/07/2010 14:02:25  _____________________________________________________________________  External Attachment:    Type:   Image     Comment:   External Document

## 2010-07-25 NOTE — Progress Notes (Signed)
Summary: Dosage of Lasix?  Phone Note Call from Patient   Caller: Patient Call For: Gordy Savers  MD Summary of Call: Weight is back in to normal, and Dr. Kirtland Bouchard asked pt to take 80 mg. daily. but he only took 80 mg one day, and started taking 40 mg. by mouth daily. as  he returned to his normal weight.   Is asking if if this is the correct dosage as he was diagnosed with CHF after his surgery last week. [phone] Initial call taken by: Lynann Beaver CMA,  January 30, 2010 10:09 AM  Follow-up for Phone Call        If edema has improved and no signif dyspnea or orthopnea OK to stay at 40 mg dose. Follow-up by: Evelena Peat MD,  January 30, 2010 1:09 PM  Additional Follow-up for Phone Call Additional follow up Details #1::        Pt. notified. Additional Follow-up by: Lynann Beaver CMA,  January 30, 2010 1:38 PM

## 2010-07-25 NOTE — Progress Notes (Signed)
Summary: med for rapid heartbeat 1-2 times, goes away 1-2 hrs  Phone Note Call from Patient Call back at [facility] Phone [phone]   Caller: vm Summary of Call: Episode of Rapid heartbeat, heartburn,yawn, belch. Once recently in surgery, applied something to wrist that stopped it, and another a week ago.  May try muscle relaxer that helped with nervous twitch.  Don't think I need to come in.  Is there any kind of med I can take for these episodes that come 1 - 2 times a year? Initial call taken by: Rudy Jew, RN,  March 13, 2010 12:12 PM  Follow-up for Phone Call        no treatment necessary if these are brief, rare and caused no symptoms Follow-up by: Gordy Savers  MD,  March 13, 2010 12:47 PM  Additional Follow-up for Phone Call Additional follow up Details #1::        Phone Call Completed Additional Follow-up by: Rudy Jew, RN,  March 13, 2010 2:53 PM

## 2010-07-25 NOTE — Op Note (Signed)
Summary: Hip Intra-Articular Injection/[facility]  Hip Intra-Articular Injection/[facility]   Imported By: Maryln Gottron 01/25/2010 13:19:18  _____________________________________________________________________  External Attachment:    Type:   Image     Comment:   External Document

## 2010-07-25 NOTE — Letter (Signed)
Summary: [facility]  [facility]   Imported By: Maryln Gottron 10/07/2009 13:58:25  _____________________________________________________________________  External Attachment:    Type:   Image     Comment:   External Document

## 2010-07-25 NOTE — Assessment & Plan Note (Signed)
Summary: f1y  Medications Added ADVIL PM 200-25 MG CAPS (IBUPROFEN-DIPHENHYDRAMINE HCL) 2 tabs at bedtime MULTIVITAMINS   TABS (MULTIPLE VITAMIN) once daily IBUPROFEN 200 MG CAPS (IBUPROFEN) as needed      Allergies Added:   Visit Type:  Follow-up Primary Provider:  Amador Cunas  CC:  leg pain.  History of Present Illness: 77 year old gentleman presents for followup of lower extremity PAD. He has bilateral superficial femoral artery stenosis and intermittent claudication. ABIs last year were actually in the normal range at 1.1 on the right and 0.96 on the left. The patient has developed progressive limitation from right hip arthritis. He is planning on having a hip replacement later this year. He plans on waiting until later in the year.  He denies calf claudication at present. He has no rest pain or ulceration. Denies chest pain or dyspnea. No other complaints at present.  Current Medications (verified): 1)  Simvastatin 40 Mg Tabs (Simvastatin) .... Take 1 Tablet By Mouth Once A Day 2)  Adult Aspirin Ec Low Strength 81 Mg  Tbec (Aspirin) .... Take 1 By Mouth Qd 3)  Benazepril Hcl 40 Mg  Tabs (Benazepril Hcl) .Marland Kitchen.. 1 By Mouth Qd 4)  Omeprazole 20 Mg  Cpdr (Omeprazole) .Marland Kitchen.. 1 By Mouth Qd 5)  Advil Pm 200-25 Mg Caps (Ibuprofen-Diphenhydramine Hcl) .... 2 Tabs At Bedtime 6)  Selenium 200 Mcg Tabs (Selenium) .... Take 1 Tablet By Mouth Once A Day 7)  Potassium 99 Mg Tabs (Potassium) .... Take 1 Tablet By Mouth Once A Day 8)  Super B Complex  Tabs (B Complex-C) .... Take 1 Tablet By Mouth Once A Day 9)  Cvs Vitamin E 400 Unit Caps (Vitamin E) .... Take 1 Capsule By Mouth Once A Day 10)  Vitamin C 500mg  Tab .... Take 1 Tablet By Mouth Once A Day 11)  Fiber Laxative 0.52 Gm Caps (Psyllium) .... Take 1 Capsule By Mouth Once A Day 12)  Multivitamins   Tabs (Multiple Vitamin) .... Once Daily 13)  Amlodipine Besylate 5 Mg Tabs (Amlodipine Besylate) .... Take One Tablet By Mouth Daily 14)  Pletal  100 Mg Tabs (Cilostazol) .... Take 1 Tablet By Mouth Two Times A Day 15)  Ibuprofen 200 Mg Caps (Ibuprofen) .... As Needed  Allergies (verified): 1)  ! Penicillin  Past History:  Past medical history reviewed for relevance to current acute and chronic problems.  Past Medical History: Reviewed history from 10/06/2009 and no changes required. Current Problems:  PERIPHERAL VASCULAR DISEASE (ICD-443.9)- Lower extremity ATHEROSCLEROSIS W /INT CLAUDICATION (ICD-440.21) ESSENTIAL HYPERTENSION (ICD-401.9) HYPERLIPIDEMIA (ICD-272.4) PALPITATIONS (ICD-785.1) CHEST PAIN (ICD-786.50) GERD (ICD-530.81) DIABETES MELLITUS, TYPE II (ICD-250.00) COLONIC POLYPS, HX OF (ICD-V12.72) CARPAL TUNNEL SYNDROME, BILATERAL (ICD-354.0) PARESTHESIA (ICD-782.0) POLYMYALGIA RHEUMATICA (ICD-725) BENIGN PROSTATIC HYPERTROPHY (ICD-600.00) POLYARTHRALGIA (ICD-719.49) HEPATOTOXICITY, DRUG-INDUCED, RISK OF (ICD-V58.69) MUSCLE PAIN (ICD-729.1) SKIN LESIONS, MULTIPLE (ICD-709.9) PREVENTIVE HEALTH CARE (ICD-V70.0) LOW BACK PAIN (ICD-724.2)  Vital Signs:  Patient profile:   77 year old male Height:      67.75 inches Weight:      172 pounds BMI:     26.44 Pulse rate:   85 / minute Resp:     16 per minute BP sitting:   130 / 60  (left arm)  Vitals Entered By: Laurance Flatten CMA (October 11, 2009 8:55 AM)  Physical Exam  General:  Pt is alert and oriented, in no acute distress. HEENT: normal Neck: normal carotid upstrokes without bruits, JVP normal Lungs: CTA CV: RRR with 2/6 ejection murmur at the right  upper sternal border Abd: soft, NT, positive BS, no bruit, no organomegaly Ext: no clubbing, cyanosis, or edema. peripheral pulses 2+ and equal Skin: warm and dry without rash    Impression & Recommendations:  Problem # 1:  PERIPHERAL VASCULAR DISEASE (ICD-443.9) The patient is stable without claudication symptoms at present. His pulse exam is normal. ABIs last year were within normal limits. Recommend  continue as much walking as possible (as is hip pain will allow). Discontinue cilostazol as he has not received any benefit from this.  Problem # 2:  ESSENTIAL HYPERTENSION (ICD-401.9) Blood pressure control, currently at goal. His updated medication list for this problem includes:    Adult Aspirin Ec Low Strength 81 Mg Tbec (Aspirin) .Marland Kitchen... Take 1 by mouth qd    Benazepril Hcl 40 Mg Tabs (Benazepril hcl) .Marland Kitchen... 1 by mouth qd    Amlodipine Besylate 5 Mg Tabs (Amlodipine besylate) .Marland Kitchen... Take one tablet by mouth daily  BP today: 130/60 Prior BP: 126/60 (08/22/2009)  Labs Reviewed: K+: 4.3 (08/02/2009) Creat: : 1.0 (08/02/2009)   Chol: 151 (08/02/2009)   HDL: 47.40 (08/02/2009)   LDL: 84 (08/02/2009)   TG: 97.0 (08/02/2009)  Patient Instructions: 1)  Your physician has recommended you make the following change in your medication: STOP Pletal 2)  Your physician wants you to follow-up in:   1 YEAR. You will receive a reminder letter in the mail two months in advance. If you don't receive a letter, please call our office to schedule the follow-up appointment.

## 2010-07-25 NOTE — Progress Notes (Signed)
Summary: med ?  Phone Note Call from Patient Call back at Home Phone [phone]   Caller: Patient Call For: Gordy Savers  MD Summary of Call: Taking Lasix 40 mg daily.  Taking OTC K  99mg . 4 daily.  Doing fine .......any changes??? [phone] Called again. Initial call taken by: Lynann Beaver CMA,  February 06, 2010 9:26 AM  Follow-up for Phone Call        OK to continue Follow-up by: Gordy Savers  MD,  February 08, 2010 12:29 PM  Additional Follow-up for Phone Call Additional follow up Details #1::        Phone Call Completed Additional Follow-up by: Rudy Jew, RN,  February 08, 2010 2:26 PM

## 2010-07-25 NOTE — Letter (Signed)
Summary: [facility]   Imported By: Maryln Gottron 07/14/2009 14:55:47  _____________________________________________________________________  External Attachment:    Type:   Image     Comment:   External Document

## 2010-07-25 NOTE — Progress Notes (Signed)
Summary: next appt?  Phone Note Call from Patient   Caller: Patient Call For: Gordy Savers  MD Summary of Call: When should pt make return appt? [phone] Off Coumadin and allowed to golf. Initial call taken by: Lynann Beaver CMA,  February 13, 2010 2:45 PM  Follow-up for Phone Call        needs ROV this week Follow-up by: Gordy Savers  MD,  February 13, 2010 5:27 PM  Additional Follow-up for Phone Call Additional follow up Details #1::        Appt scheduled. Additional Follow-up by: Lynann Beaver CMA,  February 14, 2010 8:25 AM

## 2010-07-25 NOTE — Op Note (Signed)
Summary: Right Total Hip Arthroplasty-Dr. Annell Greening  Right Total Hip Arthroplasty-Dr. Annell Greening   Imported By: Maryln Gottron 01/25/2010 13:16:21  _____________________________________________________________________  External Attachment:    Type:   Image     Comment:   External Document

## 2010-07-25 NOTE — Progress Notes (Signed)
Summary: muscle relaxer  Phone Note Call from Patient Call back at Home Phone [phone]   Caller: Patient Call For: Gordy Savers  MD Summary of Call: Pt has developed a "nervous twitch"  right side of neck.  Is asking for muscle relaxer, and wants this ASAP. Initial call taken by: Lynann Beaver CMA,  February 20, 2010 12:56 PM  Follow-up for Phone Call        generic flexeril 5  #30 one three times a day prn Follow-up by: Gordy Savers  MD,  February 20, 2010 1:53 PM    New/Updated Medications: FLEXERIL 5 MG TABS (CYCLOBENZAPRINE HCL) one by mouth three times a day prn Prescriptions: FLEXERIL 5 MG TABS (CYCLOBENZAPRINE HCL) one by mouth three times a day prn  #30 x 0   Entered by:   Lynann Beaver CMA   Authorized by:   Gordy Savers  MD   Signed by:   Lynann Beaver CMA on 02/20/2010   Method used:   Electronically to        [facility] [phone]* (retail)       [address]       [place], [place]  [address]       Ph: [phone] or [phone]       Fax: [phone]   RxID:   [id number]  Pt notified.

## 2010-07-25 NOTE — Assessment & Plan Note (Signed)
Summary: rov/dm   Vital Signs:  Patient profile:   77 year old male Weight:      167 pounds Temp:     98.1 degrees F oral BP sitting:   110 / 70  (right arm) Cuff size:   regular  Vitals Entered By: Duard Brady LPN (February 17, 2010 12:43 PM) CC: rov - doing well     occasional insomnia Is Patient Diabetic? No   Primary Care Provider:  Amador Cunas  CC:  rov - doing well     occasional insomnia.  History of Present Illness: a 77 year old patient seen today for follow-up.  He has a history of type 2 diabetes, now diet controlled.  He is seen today for follow-up of his fluid overload syndrome.  Following right hip replacement surgery.  He has lost an additional 9 pounds, and all his peripheral edema has resolved.  Presently he is on furosemide 40 mg daily.  He denies any symptoms of heart failure.  No concerns or complaints  Allergies: 1)  ! Penicillin  Past History:  Past Medical History: Reviewed history from 10/06/2009 and no changes required. Current Problems:  PERIPHERAL VASCULAR DISEASE (ICD-443.9)- Lower extremity ATHEROSCLEROSIS W /INT CLAUDICATION (ICD-440.21) ESSENTIAL HYPERTENSION (ICD-401.9) HYPERLIPIDEMIA (ICD-272.4) PALPITATIONS (ICD-785.1) CHEST PAIN (ICD-786.50) GERD (ICD-530.81) DIABETES MELLITUS, TYPE II (ICD-250.00) COLONIC POLYPS, HX OF (ICD-V12.72) CARPAL TUNNEL SYNDROME, BILATERAL (ICD-354.0) PARESTHESIA (ICD-782.0) POLYMYALGIA RHEUMATICA (ICD-725) BENIGN PROSTATIC HYPERTROPHY (ICD-600.00) POLYARTHRALGIA (ICD-719.49) HEPATOTOXICITY, DRUG-INDUCED, RISK OF (ICD-V58.69) MUSCLE PAIN (ICD-729.1) SKIN LESIONS, MULTIPLE (ICD-709.9) PREVENTIVE HEALTH CARE (ICD-V70.0) LOW BACK PAIN (ICD-724.2)  Physical Exam  General:  Well-developed,well-nourished,in no acute distress; alert,appropriate and cooperative throughout examination; normal blood pressure Mouth:  Oral mucosa and oropharynx without lesions or exudates.  Teeth in good repair. Neck:  No  deformities, masses, or tenderness noted. Lungs:  few bibasilar rales Heart:  Normal rate and regular rhythm. S1 and S2 normal without gallop, murmur, click, rub or other extra sounds. Extremities:  No clubbing, cyanosis, edema, or deformity noted with normal full range of motion of all joints.     Impression & Recommendations:  Problem # 1:  OTHER FLUID OVERLOAD (ICD-276.69) appears euvolemic will discontinue furosemide and observe  Problem # 2:  ESSENTIAL HYPERTENSION (ICD-401.9)  The following medications were removed from the medication list:    Furosemide 40 Mg Tabs (Furosemide) ..... One tablet  twice daily His updated medication list for this problem includes:    Benazepril Hcl 40 Mg Tabs (Benazepril hcl) .Marland Kitchen... 1 by mouth qd  The following medications were removed from the medication list:    Furosemide 40 Mg Tabs (Furosemide) ..... One tablet  twice daily His updated medication list for this problem includes:    Benazepril Hcl 40 Mg Tabs (Benazepril hcl) .Marland Kitchen... 1 by mouth qd  Complete Medication List: 1)  Simvastatin 40 Mg Tabs (Simvastatin) .... Take 1 tablet by mouth once a day 2)  Adult Aspirin Ec Low Strength 81 Mg Tbec (Aspirin) .... Take 1 by mouth qd 3)  Benazepril Hcl 40 Mg Tabs (Benazepril hcl) .Marland Kitchen.. 1 by mouth qd 4)  Omeprazole 20 Mg Cpdr (Omeprazole) .Marland Kitchen.. 1 by mouth qd 5)  Advil Pm 200-25 Mg Caps (Ibuprofen-diphenhydramine hcl) .... 2 tabs at bedtime 6)  Cvs Vitamin E 400 Unit Caps (Vitamin e) .... Take 1 capsule by mouth once a day 7)  Fiber Laxative 0.52 Gm Caps (Psyllium) .... Take 1 capsule by mouth once a day 8)  Multivitamins Tabs (Multiple vitamin) .... Once daily  9)  Ibuprofen 200 Mg Caps (Ibuprofen) .... As needed  Patient Instructions: 1)  Please schedule a follow-up appointment in 3 months. 2)  Limit your Sodium (Salt) to less than 2 grams a day(slightly less than 1/2 a teaspoon) to prevent fluid retention, swelling, or worsening of  symptoms.  Appended Document: rov/dm     Clinical Lists Changes  Observations: Added new observation of FLU VAX: Historical (02/24/2010 12:23)       Immunization History:  Influenza Immunization History:    Influenza:  Historical (02/24/2010) [facility]  KIK

## 2010-07-27 NOTE — Assessment & Plan Note (Signed)
Summary: palpitations/dm   Vital Signs:  Patient profile:   77 year old male Weight:      181 pounds Temp:     98.0 degrees F oral BP supine:   100 / 60  (left arm) BP sitting:   110 / 60  (left arm) Cuff size:   regular  Vitals Entered By: Duard Brady LPN (June 21, 2010 11:34 AM) CC: walking palpat/dizzy   Primary Care Provider:  Amador Cunas  CC:  walking palpat/dizzy.  History of Present Illness: 19   -year-old patient with a 5-year history of palpitations.  At approximately 130 this morning he awoke with some rapid palpitations that lasted approximately a one hour before he was able to return to sleep.  He awoke at 830 with persistent palpitations associated with dizziness, diaphoresis, and burning type midsternal chest pain.  he was evaluated today as a work in another to have SVT with a rate of 180. he states that he has palpitations they usually occur 4 times per year lasting for hours.  More recently these have occurred with less frequency and less duration of approximately 1 hour.  He predictably has burning, substernal chest pain.  He had a Myoview stress test approximately 2 years ago.  That was low risk.  He does have known PAD he will be referred to the ED for further management of his SVT probable atrial flutter  Allergies: 1)  ! Penicillin  Past History:  Past Medical History: Reviewed history from 10/06/2009 and no changes required. Current Problems:  PERIPHERAL VASCULAR DISEASE (ICD-443.9)- Lower extremity ATHEROSCLEROSIS W /INT CLAUDICATION (ICD-440.21) ESSENTIAL HYPERTENSION (ICD-401.9) HYPERLIPIDEMIA (ICD-272.4) PALPITATIONS (ICD-785.1) CHEST PAIN (ICD-786.50) GERD (ICD-530.81) DIABETES MELLITUS, TYPE II (ICD-250.00) COLONIC POLYPS, HX OF (ICD-V12.72) CARPAL TUNNEL SYNDROME, BILATERAL (ICD-354.0) PARESTHESIA (ICD-782.0) POLYMYALGIA RHEUMATICA (ICD-725) BENIGN PROSTATIC HYPERTROPHY (ICD-600.00) POLYARTHRALGIA (ICD-719.49) HEPATOTOXICITY,  DRUG-INDUCED, RISK OF (ICD-V58.69) MUSCLE PAIN (ICD-729.1) SKIN LESIONS, MULTIPLE (ICD-709.9) PREVENTIVE HEALTH CARE (ICD-V70.0) LOW BACK PAIN (ICD-724.2)  Past Surgical History: hernia x 2 Cholecystectomy Carpal tunnel (Bilateral) 2010 colonoscopy 2007 status post total hip replacement surgery July 2011  Family History: Reviewed history from 10/06/2009 and no changes required.  There is no history of vascular disease or stroke in   the family. His father had a myocardial infarction in his early 56s.      Social History: Reviewed history from 10/06/2009 and no changes required. 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    Physical Exam  General:  Well-developed,well-nourished,in no acute distress; alert,appropriate and cooperative throughout examination; 130/80; pulse 180 Head:  Normocephalic and atraumatic without obvious abnormalities. No apparent alopecia or balding. Eyes:  No corneal or conjunctival inflammation noted. EOMI. Perrla. Funduscopic exam benign, without hemorrhages, exudates or papilledema. Vision grossly normal. Mouth:  Oral mucosa and oropharynx without lesions or exudates.  Teeth in good repair. Neck:  No deformities, masses, or tenderness noted. Lungs:  bibasilar crackles O2 saturation  98% Heart:  very rapid tachycardia, rate 180 Abdomen:  Bowel sounds positive,abdomen soft and non-tender without masses, organomegaly or hernias noted. Msk:  No deformity or scoliosis noted of thoracic or lumbar spine.   Pulses:  pedal pulses absent Extremities:  no edema Skin:  Intact without suspicious lesions or rashes Cervical Nodes:  No lymphadenopathy noted Axillary Nodes:  No palpable lymphadenopathy Inguinal Nodes:  No significant adenopathy Psych:  Cognition and judgment appear intact. Alert and cooperative with normal attention span and concentration. No apparent delusions, illusions, hallucinations   Impression & Recommendations:  Problem # 1:  PALPITATIONS (ICD-785.1) patient has an SVT probable atrial flutter with a rapid ventricular response.  He will be referred via EMS to the ED for further management and AV nodal blocking drugs.  Cardiology has been notified  Problem # 2:  PERIPHERAL VASCULAR DISEASE (ICD-443.9)  Problem # 3:  ESSENTIAL HYPERTENSION (ICD-401.9)  His updated medication list for this problem includes:    Benazepril Hcl 40 Mg Tabs (Benazepril hcl) .Marland Kitchen... 1 by mouth qd    Furosemide 40 Mg Tabs (Furosemide) .Marland Kitchen... 1 by mouth qd  Problem # 4:  HYPERLIPIDEMIA (ICD-272.4)  His updated medication list for this problem includes:    Simvastatin 40 Mg Tabs (Simvastatin) .Marland Kitchen... Take 1 tablet by mouth once a day  Complete Medication List: 1)  Simvastatin 40 Mg Tabs (Simvastatin) .... Take 1 tablet by mouth once a day 2)  Adult Aspirin Ec Low Strength 81 Mg Tbec (Aspirin) .... Take 1 by mouth qd 3)  Benazepril Hcl 40 Mg Tabs (Benazepril hcl) .Marland Kitchen.. 1 by mouth qd 4)  Omeprazole 20 Mg Cpdr (Omeprazole) .Marland Kitchen.. 1 by mouth qd 5)  Advil Pm 200-25 Mg Caps (Ibuprofen-diphenhydramine hcl) .... 2 tabs at bedtime 6)  Cvs Vitamin E 400 Unit Caps (Vitamin e) .... Take 1 capsule by mouth once a day 7)  Fiber Laxative 0.52 Gm Caps (Psyllium) .... Take 1 capsule by mouth once a  day 8)  Multivitamins Tabs (Multiple vitamin) .... Once daily 9)  Ibuprofen 200 Mg Caps (Ibuprofen) .... As needed 10)  Flexeril 5 Mg Tabs (Cyclobenzaprine hcl) .... One by mouth three times a day prn 11)  Furosemide 40 Mg Tabs (Furosemide) .Marland Kitchen.. 1 by mouth qd  Patient Instructions: 1)  patient will be transported via EMS to the ED for further management of his SVT   Orders Added: 1)  Est. Patient Level V [09811]

## 2010-07-27 NOTE — Assessment & Plan Note (Signed)
Summary: eph.gd  Medications Added SUPER B COMPLEX  TABS (B COMPLEX-C) once daily GLUCOSAMINE-CHONDROITIN   CAPS (GLUCOSAMINE-CHONDROIT-VIT C-MN) once daily        Primary Provider:  Amador Cunas   History of Present Illness: The patient presents today for routine electrophysiology followup. He reports doing very well since his recent hospital discharge.  He denies any further episodes of SVT.  The patient denies symptoms of palpitations, chest pain, shortness of breath, orthopnea, PND, lower extremity edema, dizziness, presyncope, syncope, or neurologic sequela. The patient is tolerating medications without difficulties and is otherwise without complaint today.   Current Medications (verified): 1)  Simvastatin 40 Mg Tabs (Simvastatin) .... Take 1 Tablet By Mouth Once A Day 2)  Adult Aspirin Ec Low Strength 81 Mg  Tbec (Aspirin) .... Take 1 By Mouth Qd 3)  Benazepril Hcl 40 Mg  Tabs (Benazepril Hcl) .Marland Kitchen.. 1 By Mouth Qd 4)  Advil Pm 200-25 Mg Caps (Ibuprofen-Diphenhydramine Hcl) .... 2 Tabs At Bedtime 5)  Fiber Laxative 0.52 Gm Caps (Psyllium) .... Take 1 Capsule By Mouth Once A Day 6)  Multivitamins   Tabs (Multiple Vitamin) .... Once Daily 7)  Ibuprofen 200 Mg Caps (Ibuprofen) .... As Needed 8)  Super B Complex  Tabs (B Complex-C) .... Once Daily 9)  Glucosamine-Chondroitin   Caps (Glucosamine-Chondroit-Vit C-Mn) .... Once Daily  Allergies: 1)  ! Penicillin  Past History:  Past Medical History:  1. Long RP tachycardia/supraventricular tachycardia.   2. Hypertension.   3. Hyperlipidemia.   4. History of chest pain.       a.     March 2009, Myoview showing inferior attenuation with        ejection fraction of 58%, felt to be a low risk study.   5. Diabetes mellitus.   6. Peripheral vascular disease/claudication.       a.     Ankle-brachial indices in 2010 were normal on the right  (1.1), and left (0.96).   7. History of colonic polyps.   8. Gastroesophageal reflux disease.   9. History of carpal tunnel surgery bilaterally in 2010.   10.Polymyalgia rheumatica.   11.Benign prostatic hypertrophy.   12.Low back pain.   13.Status post hernia repair x2.   14.Status post cholecystectomy.   15.Status post right total hip arthroplasty, 2011-08-11complicated by postop ileus.   Past Surgical History: Reviewed history from 06/21/2010 and no changes required. hernia x 2 Cholecystectomy Carpal tunnel (Bilateral) 2010 colonoscopy 2007 status post total hip replacement surgery 02/02/10 Family History: Mother died at age 58 in a fire.  Father died at 31 of   an MI.  His paternal grandfather died at 13 of an MI.  He has a sister   who is age 48 and is status post nephrectomy but otherwise, alive and well.   Social History:  The patient lives in [place] by himself.  He has  been married and divorced three times.  He has a 43-year-old and also a  75 year old and has them very other weekend.  He is currently divorced.  He works in Conservation officer, nature.  He has a 49-pack-year history of  tobacco use, quitting about 12 years ago.  He has one alcoholic beverage  about 3-4 times per week.  He has not had any caffeine in 4 years.  He  tries to remain active and plays golf but does not routinely exercise. has lived in [place]  for 25 years  Review of Systems  All systems are reviewed and negative except as listed in the HPI.   + nasal congestion and cough x several days  Vital Signs:  Patient profile:   77 year old male Height:      67.75 inches Weight:      182 pounds BMI:     27.98 Pulse rate:   60 / minute BP sitting:   154 / 70  (left arm)  Vitals Entered By: Laurance Flatten CMA (July 05, 2010 8:49 AM)  Physical Exam  General:  Well developed, well nourished, in no acute distress. Head:  normocephalic and atraumatic Eyes:  PERRLA/EOM intact; conjunctiva and lids normal. Mouth:  Teeth, gums and palate normal. Oral mucosa normal. Neck:  supple Lungs:   Clear bilaterally to auscultation and percussion. Heart:  Non-displaced PMI, chest non-tender; regular rate and rhythm, S1, S2 without murmurs, rubs or gallops. Carotid upstroke normal, no bruit. Normal abdominal aortic size, no bruits. Femorals normal pulses, no bruits. Pedals normal pulses. No edema, no varicosities. Abdomen:  Bowel sounds positive; abdomen soft and non-tender without masses, organomegaly, or hernias noted. No hepatosplenomegaly. Msk:  Back normal, normal gait. Muscle strength and tone normal. Extremities:  No clubbing or cyanosis. Neurologic:  Alert and oriented x 3. Skin:  Intact without lesions or rashes. Cervical Nodes:  no significant adenopathy Psych:  Normal affect.   EKG  Procedure date:  07/05/2010  Findings:      sinus rhythm 60 bpm, RBBB  Impression & Recommendations:  Problem # 1:  SUPRAVENTRICULAR ARRHYTHMIA (ICD-427.0) The patient has had multiple episodes of documented SVT which have terminated with adenosine.  I have reviewed his EKG from 12/11 which reveals an SVT.  Though this initially looks possibly like atrial flutter, further review leads me to this that this is a long RP tachycardia.  Certainly, the fact that it terminated with adenosine leads me to think that this was not atrial flutter. Therapeutic strategies for SVT including medicine and ablation were discussed in detail with the patient today. Risk, benefits, and alternatives to EP study and radiofrequency ablation were also discussed in detail today. These risks include but are not limited to stroke, bleeding, vascular damage, tamponade, perforation, damage to the heart and other structures,AV block requiring a pacemaker, worsening renal function, and death. The patient understands these risk and wishes to continue as needed cardizem at this time.  Though he is very interested in ablation, he states that he recently started a new job and cannot take time off of work for ablation. He will contact  my office if he wishes to proceed with ablation.  Otherwise, we will see him again in 3 months.  He will take cardizem as needed for SVT.  Problem # 2:  ESSENTIAL HYPERTENSION (ICD-401.9) above goal, though he reports good blood pressure control at home. he is instructed to follow BP closely at home and present values to [place] upon return.  I would favor cardizem CD for BP control in addition to current meds at this would likely help his SVT also. salt restriction  Problem # 3:  HYPERLIPIDEMIA (ICD-272.4) stable His updated medication list for this problem includes:    Simvastatin 40 Mg Tabs (Simvastatin) .Marland Kitchen... Take 1 tablet by mouth once a day  Patient Instructions: 1)  Your physician recommends that you schedule a follow-up appointment in: 3 months with Dr Johney Frame

## 2010-07-27 NOTE — Assessment & Plan Note (Signed)
Summary: gout/dm   Vital Signs:  Patient profile:   77 year old male Weight:      178 pounds Temp:     98.0 degrees F oral BP sitting:   146 / 78  (left arm) Cuff size:   regular  Vitals Entered By: Duard Brady LPN (July 14, 2010 9:09 AM) CC: gout? (L) foot pain Is Patient Diabetic? No   Primary Care Provider:  Amador Cunas  CC:  gout? (L) foot pain.  History of Present Illness:  77 year old patient who presents with a several week history of pain across the metatarsal heads of the left foot.  He is quite active, and still actively employed in Air[employer], spending a considerable amount of time on his feet.  He usually wears loafers.  There's been no trauma.  His concern about possible gout, but there is no personal history. His cardiac status discussed and has been stable  Allergies: 1)  ! Penicillin  Physical Exam  General:  Well-developed,well-nourished,in no acute distress; alert,appropriate and cooperative throughout examination Msk:  mild tenderness along the metatarsal heads of the left foot Pulses:  R and L carotid,radial,femoral,dorsalis pedis and posterior tibial pulses are full and equal bilaterally   Impression & Recommendations:  Problem # 1:  FOOT PAIN, LEFT (ICD-729.5) proper foot care discussed; this appears related to overuse and repetitive trauma  Problem # 2:  SUPRAVENTRICULAR ARRHYTHMIA (ICD-427.0)  His updated medication list for this problem includes:    Adult Aspirin Ec Low Strength 81 Mg Tbec (Aspirin) .Marland Kitchen... Take 1 by mouth qd stable  His updated medication list for this problem includes:    Adult Aspirin Ec Low Strength 81 Mg Tbec (Aspirin) .Marland Kitchen... Take 1 by mouth qd  Complete Medication List: 1)  Simvastatin 40 Mg Tabs (Simvastatin) .... Take 1 tablet by mouth once a day 2)  Adult Aspirin Ec Low Strength 81 Mg Tbec (Aspirin) .... Take 1 by mouth qd 3)  Benazepril Hcl 40 Mg Tabs (Benazepril hcl) .Marland Kitchen.. 1 by mouth qd 4)  Advil Pm 200-25 Mg  Caps (Ibuprofen-diphenhydramine hcl) .... 2 tabs at bedtime 5)  Fiber Laxative 0.52 Gm Caps (Psyllium) .... Take 1 capsule by mouth once a day 6)  Multivitamins Tabs (Multiple vitamin) .... Once daily 7)  Ibuprofen 200 Mg Caps (Ibuprofen) .... As needed 8)  Super B Complex Tabs (B complex-c) .... Once daily 9)  Glucosamine-chondroitin Caps (Glucosamine-chondroit-vit c-mn) .... Once daily  Patient Instructions: 1)  Limit your Sodium (Salt) to less than 2 grams a day(slightly less than 1/2 a teaspoon) to prevent fluid retention, swelling, or worsening of symptoms. 2)  annual exam as scheduled 3)  Proper foot care as discussed   Orders Added: 1)  Est. Patient Level III [04540]

## 2010-07-27 NOTE — Progress Notes (Signed)
Summary: discuss meds benazerpil vs diltiazem    Phone Note Call from Patient Call back at [facility] Phone [phone]   Caller: Patient  Reason for Call: Talk to Nurse Summary of Call: pt has question regarding BENAZEPRIL HCL 40 MG  vs dILTIAZEM  60 MG from dr. Amador Cunas. Initial call taken by: Lorne Skeens,  July 21, 2010 10:42 AM  Follow-up for Phone Call        Discussed with Dr Johney Frame pt to continue Benazapril for his BP and take the short acting Diltiazem for episodes of the fast HR that he only has 3-4 times a year.  Pt aware Dennis Bast, RN, BSN  July 21, 2010 12:26 PM

## 2010-08-09 NOTE — Consult Note (Signed)
NAMEJIMMYLEE, Jorge Smith NO.:  [id number]  MEDICAL RECORD NO.:  [id number]          PATIENT TYPE:  EMS  LOCATION:  [facility]                         FACILITY:  [facility]  PHYSICIAN:  Hillis Range, MD       DATE OF BIRTH:  02/14/1933  DATE OF CONSULTATION:  06/21/2010 DATE OF DISCHARGE:  06/21/2010                                CONSULTATION   PRIMARY CARDIOLOGIST:  Veverly Fells. Excell Seltzer, MD  PRIMARY CARE Kyaire Gruenewald:  Gordy Savers, MD  THE PATIENT PROFILE:  This is a 77 year old male with a long history of palpitations and documented SVT in July of this year who presents with recurrent SVT which is adenosine responsive.  PROBLEMS: 1. Long RP tachycardia/supraventricular tachycardia. 2. Hypertension. 3. Hyperlipidemia. 4. History of chest pain.     a.     March 2009, Myoview showing inferior attenuation with      ejection fraction of 58%, felt to be a low risk study. 5. Diabetes mellitus. 6. Peripheral vascular disease/claudication.     a.     Ankle-brachial indices in 2010 were normal on the right      (1.1), and left (0.96). 7. History of colonic polyps. 8. Gastroesophageal reflux disease. 9. History of carpal tunnel surgery bilaterally in 2010. 10.Polymyalgia rheumatica. 11.Benign prostatic hypertrophy. 12.Low back pain. 13.Status post hernia repair x2. 14.Status post cholecystectomy. 15.Status post right total hip arthroplasty, July 2011, complicated by     postop ileus.  HISTORY OF PRESENT ILLNESS:  This is a 77 year old male with the above problem list.  The patient has a long history of palpitations dating back about 5 years ago when he began to note occasional tachy palpitations, occurring about four times per year, lasting 4-5 hours at a time, and resolving spontaneously.  About 4 years ago, he quit caffeine and continued to have occasional episodes of tachy palpitations but on that point, they were only lasting about an hour at a time.  The patient  underwent total hip replacement in July and postoperatively had a run of SVT which was successfully treated with adenosine.  He had another brief episode of tachy palpitations in September as well. This morning, the patient awoke at about 1:30 a.m. with tachy palpitations, similar to what he has had in the past.  He sat up for about an hour, and symptoms resolved spontaneously.  He was able to fall back asleep.  When he got up again at 8:30 this morning, he felt dizzy. This persisted longer than he was comfortable with, and he call his physician's office.  He was seen by Dr. Amador Cunas today and EKG was performed and showed SVT at a rate of 182.  Of note, despite feeling dizzy this morning, the patient did not feel tachy palpitations.  EMS was called and the patient was given adenosine en route with conversion to sinus rhythm.  In the ED, he has remained in sinus rhythm and feels well.  As noted above, he does not drink caffeine.  He quit smoking about 12 years ago.  He does drink alcohol but limited to one drink 3-4 times per week.  ALLERGIES:  PENICILLIN.  HOME MEDICATIONS: 1. Simvastatin 40 mg daily. 2. Benazepril 40 mg daily. 3. Aspirin 81 mg daily. 4. Multivitamin daily. 5. Prilosec 20 mg daily. 6. Advil PM 2 tablets nightly. 7. Vitamin E 400 units daily. 8. Fiber Laxative daily. 9. Flexeril 5 mg p.r.n. 10.Lasix 40 mg daily.  FAMILY HISTORY:  Mother died at age 20 in a fire.  Father died at 7 of an MI.  His paternal grandfather died at 49 of an MI.  He has a sister who is age 51 and is status post nephrectomy but otherwise, alive and well.  SOCIAL HISTORY:  The patient lives in [place] by himself.  He has been married and divorced three times.  He has a 29-year-old and also a 75 year old and has them very other weekend.  He is currently divorced. He works in Conservation officer, [employer].  He has a 49-pack-year history of tobacco use, quitting about 12 years ago.  He has  one alcoholic beverage about 3-4 times per week.  He has not had any caffeine in 4 years.  He tries to remain active and plays golf but does not routinely exercise.  REVIEW OF SYSTEMS:  Positive for tachy palpitations with indigestion associated with those palpitations.  Otherwise, all systems reviewed and negative.  He is a full code.  PHYSICAL EXAMINATION:  VITAL SIGNS:  Temperature 97.4, heart rate 69, respirations 14, blood pressure 121/80, pulse ox 95% on room air. GENERAL:  A pleasant white male, in no acute distress, awake, alert x3. He has a normal affect. HEENT:  Normal. NEUROLOGIC:  Grossly intact.  Nonfocal. SKIN:  Warm and dry without lesions or masses. NECK:  Supple without bruits, JVD. LUNGS:  Respirations are unlabored.  Clear to auscultation. CARDIAC:  Regular, S1 and S2.  No S3, S4, murmurs. ABDOMEN:  Round, Soft, nontender, nondistended.  Bowel sounds present x4. EXTREMITIES:  Warm, dry, pink.  No clubbing, cyanosis, or edema. Dorsalis pedis, posterior tibial pulses 2+ and equal bilaterally.  EKG shows long RP SVT at a rate of 182 with a normal axis.  No acute ST or T changes.  Sodium 142, potassium 4.6, chloride 109, CO2 is 27, BUN 18, creatinine 1.1, glucose 106, CK-MB less than 1.0, troponin I less than 0.05.  ASSESSMENT AND PLAN: 1. Long RP tachycardia/supraventricular tachycardia.  This broke with     adenosine by EMS.  The patient remains in sinus rhythm, and he is     feeling good and ready to go home.  We will check TSH, magnesium,     and echocardiogram as scheduled in the outpatient setting on     June 27, 2010, at 7:30 a.m.  We would provide him with     prescription for diltiazem 60 mg q.6 h p.r.n. recurrent     tachycardia.  We have arranged for followup with Dr. Johney Frame on     July 05, 2010, at 9:15 a.m. to further discuss the possibility     of EP study and ablation. 2. Hypertension.  Stable. 3. Hyperlipidemia.  He is on a statin, followed  by his PCP. 4. Diabetes mellitus.  Appears to be diet controlled. 5. Ethyl alcohol abuse.  The patient has one drink 3-4 times per week.     In the setting of supraventricular tachycardia, we have recommended     that he discontinue all alcohol.     Nicolasa Ducking, ANP   ______________________________ Hillis Range, MD    CB/MEDQ  D:  06/21/2010  T:  06/22/2010  Job:  [id number]  Electronically Signed by Nicolasa Ducking ANP on 07/17/2010 03:48:02 PM Electronically Signed by Hillis Range MD on 08/09/2010 10:20:14 PM

## 2010-08-25 ENCOUNTER — Other Ambulatory Visit: Payer: Self-pay | Admitting: Internal Medicine

## 2010-08-25 DIAGNOSIS — I10 Essential (primary) hypertension: Secondary | ICD-10-CM

## 2010-09-04 LAB — POCT I-STAT, CHEM 8
BUN: 18 mg/dL (ref 6–23)
Calcium, Ion: 1.23 mmol/L (ref 1.12–1.32)
Glucose, Bld: 106 mg/dL — ABNORMAL HIGH (ref 70–99)
HCT: 43 % (ref 39.0–52.0)
Hemoglobin: 14.6 g/dL (ref 13.0–17.0)
Potassium: 4.6 mEq/L (ref 3.5–5.1)

## 2010-09-05 ENCOUNTER — Telehealth: Payer: Self-pay | Admitting: Cardiovascular Disease

## 2010-09-09 LAB — URINE MICROSCOPIC-ADD ON

## 2010-09-09 LAB — BASIC METABOLIC PANEL
BUN: 13 mg/dL (ref 6–23)
BUN: 16 mg/dL (ref 6–23)
CO2: 25 mEq/L (ref 19–32)
CO2: 25 mEq/L (ref 19–32)
Calcium: 9.4 mg/dL (ref 8.4–10.5)
Chloride: 102 mEq/L (ref 96–112)
Chloride: 106 mEq/L (ref 96–112)
Creatinine, Ser: 1.03 mg/dL (ref 0.4–1.5)
Creatinine, Ser: 1.23 mg/dL (ref 0.4–1.5)
GFR calc non Af Amer: 57 mL/min — ABNORMAL LOW (ref >60)
Glucose, Bld: 120 mg/dL — ABNORMAL HIGH (ref 70–99)
Glucose, Bld: 149 mg/dL — ABNORMAL HIGH (ref 70–99)
Potassium: 4.1 mEq/L (ref 3.5–5.1)
Potassium: 4.3 mEq/L (ref 3.5–5.1)
Potassium: 4.7 mEq/L (ref 3.5–5.1)
Sodium: 134 mEq/L — ABNORMAL LOW (ref 135–145)
Sodium: 138 mEq/L (ref 135–145)

## 2010-09-09 LAB — PROTIME-INR
INR: 0.98 (ref 0.00–1.49)
INR: 1.16 (ref 0.00–1.49)
INR: 1.85 — ABNORMAL HIGH (ref 0.00–1.49)
INR: 2.25 — ABNORMAL HIGH (ref 0.00–1.49)
Prothrombin Time: 12.9 seconds (ref 11.6–15.2)
Prothrombin Time: 14.7 seconds (ref 11.6–15.2)
Prothrombin Time: 21.2 seconds — ABNORMAL HIGH (ref 11.6–15.2)

## 2010-09-09 LAB — URINE CULTURE: Colony Count: NO GROWTH

## 2010-09-09 LAB — CBC
HCT: 28.9 % — ABNORMAL LOW (ref 39.0–52.0)
HCT: 37 % — ABNORMAL LOW (ref 39.0–52.0)
HCT: 43.1 % (ref 39.0–52.0)
Hemoglobin: 14.3 g/dL (ref 13.0–17.0)
Hemoglobin: 14.6 g/dL (ref 13.0–17.0)
Hemoglobin: 9.7 g/dL — ABNORMAL LOW (ref 13.0–17.0)
Hemoglobin: 9.8 g/dL — ABNORMAL LOW (ref 13.0–17.0)
MCH: 33.3 pg (ref 26.0–34.0)
MCHC: 34 g/dL (ref 30.0–36.0)
MCHC: 34 g/dL (ref 30.0–36.0)
MCHC: 34.1 g/dL (ref 30.0–36.0)
MCV: 98.1 fL (ref 78.0–100.0)
MCV: 98.1 fL (ref 78.0–100.0)
Platelets: 208 10*3/uL (ref 150–400)
Platelets: 276 10*3/uL (ref 150–400)
Platelets: 337 10*3/uL (ref 150–400)
RDW: 14.6 % (ref 11.5–15.5)
RDW: 15.1 % (ref 11.5–15.5)
WBC: 12.5 10*3/uL — ABNORMAL HIGH (ref 4.0–10.5)

## 2010-09-09 LAB — URINALYSIS, ROUTINE W REFLEX MICROSCOPIC
Bilirubin Urine: NEGATIVE
Glucose, UA: NEGATIVE mg/dL
Glucose, UA: NEGATIVE mg/dL
Ketones, ur: 15 mg/dL — AB
Ketones, ur: NEGATIVE mg/dL
Nitrite: NEGATIVE
Specific Gravity, Urine: 1.018 (ref 1.005–1.030)
pH: 5 (ref 5.0–8.0)
pH: 5 (ref 5.0–8.0)

## 2010-09-09 LAB — COMPREHENSIVE METABOLIC PANEL
AST: 22 U/L (ref 0–37)
Alkaline Phosphatase: 77 U/L (ref 39–117)
CO2: 25 mEq/L (ref 19–32)
Creatinine, Ser: 1.77 mg/dL — ABNORMAL HIGH (ref 0.4–1.5)
GFR calc Af Amer: 46 mL/min — ABNORMAL LOW (ref >60)

## 2010-09-09 LAB — CARDIAC PANEL(CRET KIN+CKTOT+MB+TROPI)
CK, MB: 5.7 ng/mL — ABNORMAL HIGH (ref 0.3–4.0)
Relative Index: 0.7 (ref 0.0–2.5)
Total CK: 795 U/L — ABNORMAL HIGH (ref 7–232)
Total CK: 876 U/L — ABNORMAL HIGH (ref 7–232)

## 2010-09-09 LAB — SURGICAL PCR SCREEN: MRSA, PCR: NEGATIVE

## 2010-09-09 LAB — CULTURE, BLOOD (ROUTINE X 2): Culture: NO GROWTH

## 2010-09-09 LAB — GLUCOSE, CAPILLARY
Glucose-Capillary: 118 mg/dL — ABNORMAL HIGH (ref 70–99)
Glucose-Capillary: 127 mg/dL — ABNORMAL HIGH (ref 70–99)
Glucose-Capillary: 157 mg/dL — ABNORMAL HIGH (ref 70–99)
Glucose-Capillary: 171 mg/dL — ABNORMAL HIGH (ref 70–99)
Glucose-Capillary: 198 mg/dL — ABNORMAL HIGH (ref 70–99)

## 2010-09-12 NOTE — Progress Notes (Signed)
Summary: pt wants to start meds again   Phone Note Call from Patient Call back at Home Phone [phone]   Caller: Patient Reason for Call: Talk to Nurse, Talk to Doctor Summary of Call: pt was on Pletal 100 Mg Tabs (Cilostazol) .... Take 1 Tablet By Mouth Two Times A Day and his legs are starting to bother him again and he wants to try this med again to see if it will work so please call it into [facility] on [address]...he would like a 90day supply/lg Initial call taken by: Omer Jack,  September 05, 2010 11:12 AM  Follow-up for Phone Call        This pt is due for an appt with Dr Excell Seltzer in April. Pt's pletal was stopped at 10/11/09 OV due to no benefit.  Pt has recent history of fluid overload.  I will forward this information to Dr Excell Seltzer for review. Julieta Gutting, RN, BSN  September 05, 2010 4:27 PM  Additional Follow-up for Phone Call Additional follow up Details #1::        I would wait until he comes back for followup - it didn't help him last time. Additional Follow-up by: Norva Karvonen, MD,  September 05, 2010 5:33 PM    Additional Follow-up for Phone Call Additional follow up Details #2::    Left message for pt to call back. Julieta Gutting, RN, BSN  September 05, 2010 6:10 PM  Marcelino Duster (scheduler) called to let me know that this pt called back into the office and was mad because he did not get a response from our office until 6:00 last night and that he would like a return phone call ASAP.  I called the pt and made him aware of Dr Earmon Phoenix recommendation.  I made the pt aware that per documentation from 09/2009 OV note the pt did not have benefits from this medication.  The pt corrected me and told me that he told Dr Excell Seltzer that he did not know if this medication had given him any benefit and that is why it was stopped.  The pt c/o leg pain and I made him aware that Dr Excell Seltzer would need to see him in the office for evaluation.  I attempted to arrange appt for pt but he once again got mad  and said he did not know his schedule and that "[place] did not make things convenient."  The pt said he would call back if he wanted to make an appt with our office.  Follow-up by: Julieta Gutting, RN, BSN,  September 06, 2010 12:28 PM

## 2010-09-17 IMAGING — CR DG HIP 1V PORT*R*
1 series · 1 of 1 positions shown · non-contrast
Comparison: None.

CLINICAL DATA: Post right hip arthroplasty

PORTABLE RIGHT HIP - 1 VIEW

[cross table lat hip]
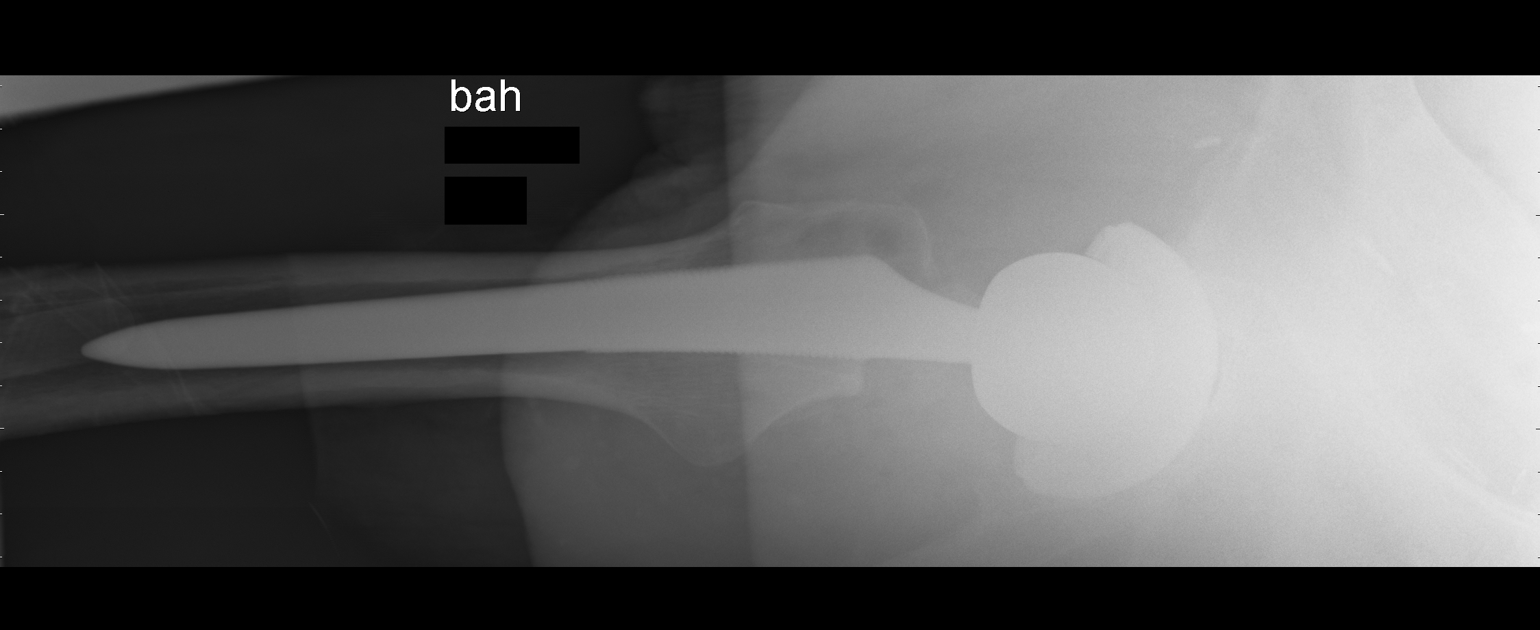

[1 of 1 positions shown; findings below may reference images not displayed]

FINDINGS: A coned-down cross-table lateral view shows good position
alignment of bony elements hardware, following right hip
arthroplasty.
IMPRESSION: Good position and alignment postoperatively.

## 2010-09-20 IMAGING — CR DG CHEST 2V
1 series · 1 of 1 positions shown · non-contrast
Comparison: CT 01/17/2010.

CLINICAL DATA: Postop cough.  Fever.  Pneumonia.

CHEST - 2 VIEW

[w chest lat]
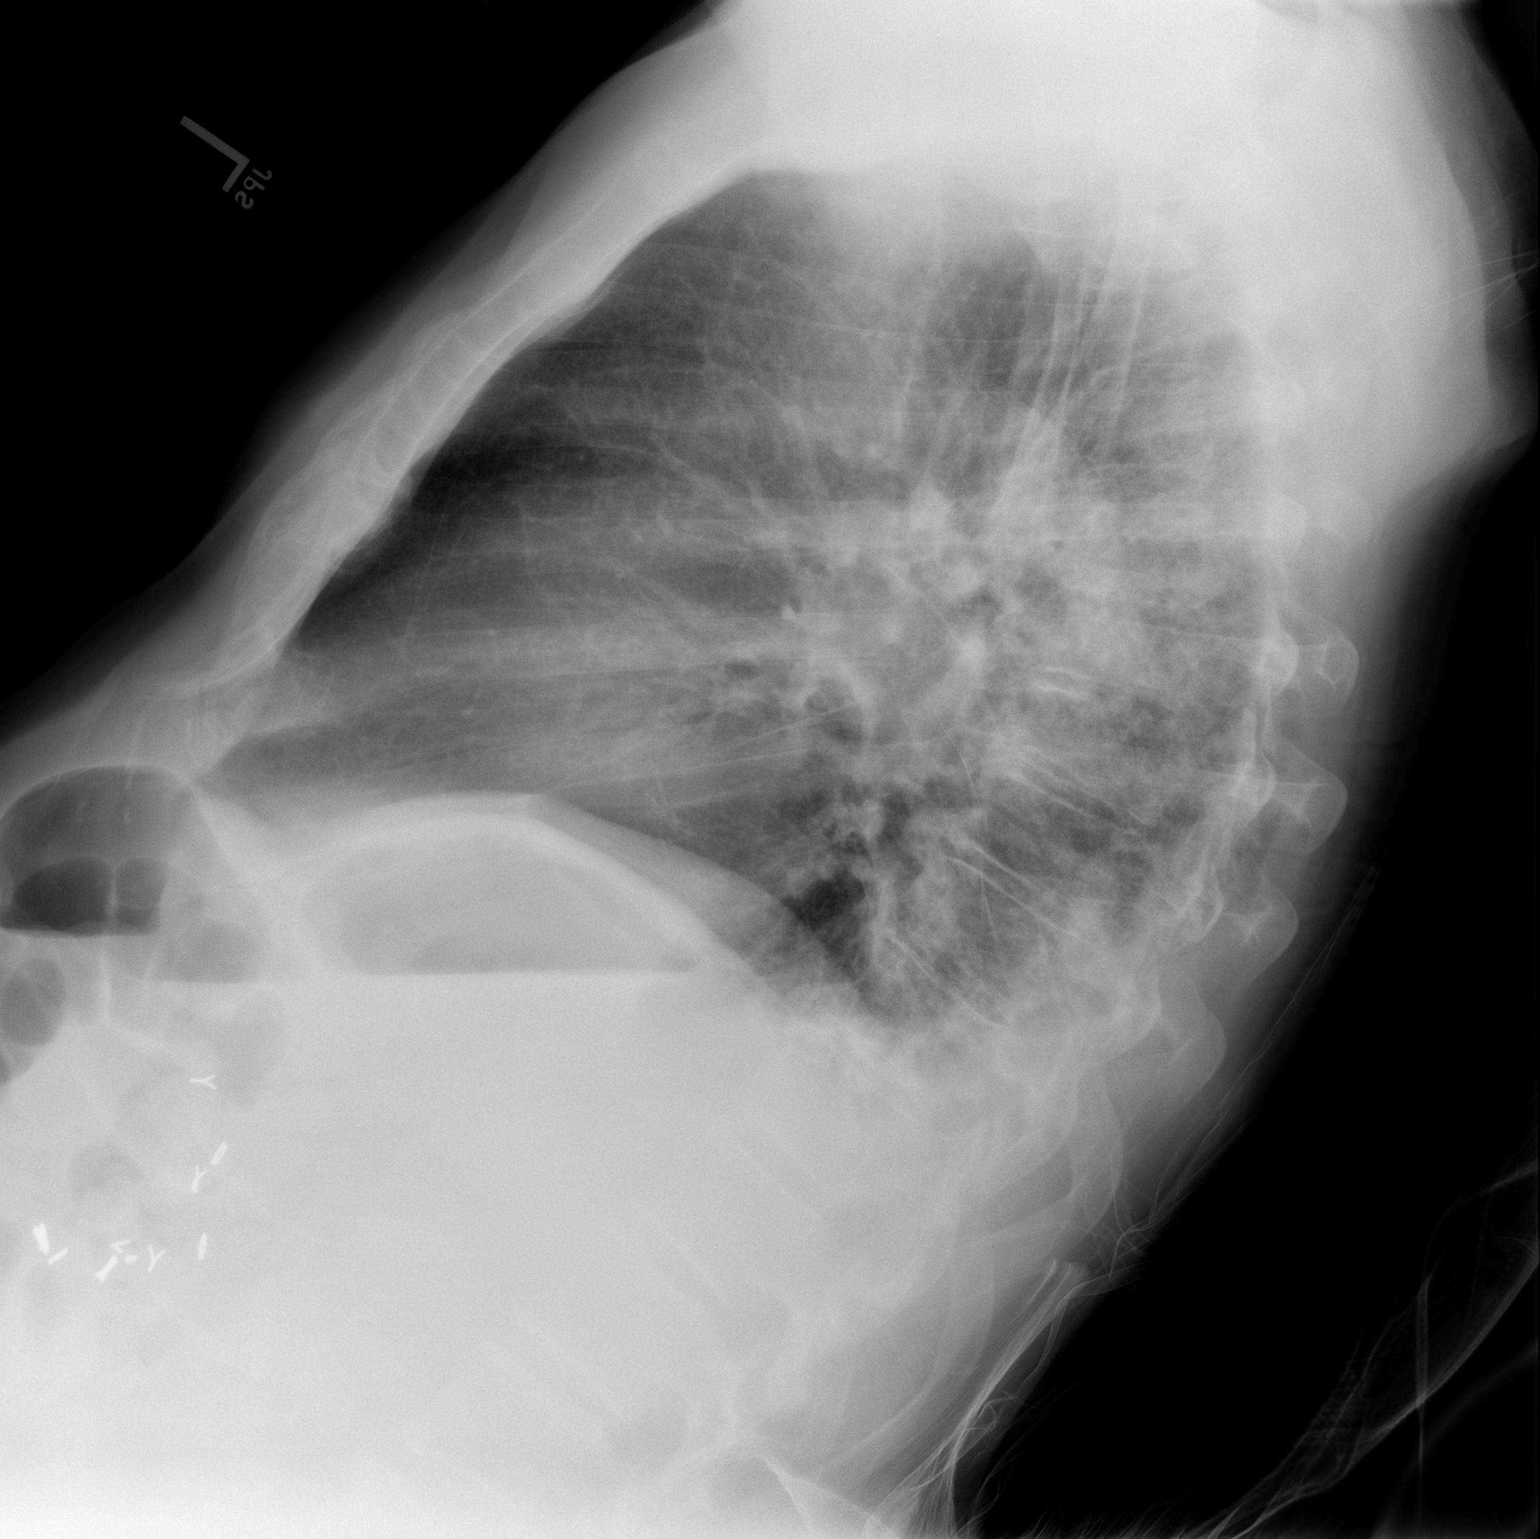

[1 of 1 positions shown; findings below may reference images not displayed]

FINDINGS: Airspace disease is seen in the right upper lobe and
right lower lobe.  This is essentially stable since prior study.
Left lower lobe airspace disease is not as well visualized,
possibly improved.  There are small bilateral effusions seen on the
lateral view.  Heart is upper limits normal in size.
IMPRESSION: Right upper lobe and right lower lobe airspace disease likely not
significantly changed.

Left lower lobe airspace disease not as well visualized and may be
improved.

Small bilateral effusions seen on the lateral view.

## 2010-09-26 LAB — POCT I-STAT, CHEM 8
BUN: 17 mg/dL (ref 6–23)
Creatinine, Ser: 0.9 mg/dL (ref 0.4–1.5)
Potassium: 4.4 mEq/L (ref 3.5–5.1)
Sodium: 141 mEq/L (ref 135–145)
TCO2: 26 mmol/L (ref 0–100)

## 2010-09-27 LAB — POCT I-STAT, CHEM 8
Chloride: 108 mEq/L (ref 96–112)
Creatinine, Ser: 0.9 mg/dL (ref 0.4–1.5)
Glucose, Bld: 117 mg/dL — ABNORMAL HIGH (ref 70–99)
HCT: 48 % (ref 39.0–52.0)
Hemoglobin: 16.3 g/dL (ref 13.0–17.0)
Potassium: 4.3 mEq/L (ref 3.5–5.1)
Sodium: 144 mEq/L (ref 135–145)

## 2010-10-18 ENCOUNTER — Other Ambulatory Visit: Payer: Self-pay | Admitting: Orthopaedic Surgery

## 2010-10-18 DIAGNOSIS — M545 Low back pain: Secondary | ICD-10-CM

## 2010-10-19 ENCOUNTER — Other Ambulatory Visit: Payer: Self-pay

## 2010-10-30 ENCOUNTER — Telehealth: Payer: Self-pay | Admitting: Internal Medicine

## 2010-10-30 NOTE — Telephone Encounter (Signed)
Pt is feeling fine now and is at work until 7pm but will come by for an EKG tomorrow morning at 7am

## 2010-10-30 NOTE — Telephone Encounter (Signed)
Pt needs to know how long he should he wait between taking another diltiazem 60mg  when his heart is out of rhythm

## 2010-10-30 NOTE — Telephone Encounter (Signed)
Called pt back and he is coming and will come for an EKG now

## 2010-10-30 NOTE — Telephone Encounter (Signed)
Pt calling back re ekg tomorrow-pls call [phone]

## 2010-10-31 NOTE — Telephone Encounter (Signed)
Patient came in for his EKG and his HR was 188.  Dr Tenny Craw came is and saw patient  Dr Gala Romney did a carotid massage and the rhythm broke and he returned to NSR at 75  We are going to show to Dr Allred when he returns to office.  Patient says he has only had 3 times since December

## 2010-11-02 ENCOUNTER — Encounter: Payer: Self-pay | Admitting: Cardiovascular Disease

## 2010-11-03 ENCOUNTER — Telehealth: Payer: Self-pay | Admitting: *Deleted

## 2010-11-03 NOTE — Telephone Encounter (Signed)
Would not recommend treatment unless the patient becomes symptomatic with sore throat and fever; okay to call in prescription for amoxicillin 500 mg #21 with the weekend approaching. Again would not take this medication unless he becomes ill

## 2010-11-03 NOTE — Telephone Encounter (Signed)
Left message for pt to call back if he wants the antibiotic called in.

## 2010-11-03 NOTE — Telephone Encounter (Signed)
Pt. Was with his girlfriend last night, and kissed her several times.  Today she was diagnosed with Strep. and he wants Dr. Kirtland Bouchard to give him a RX to prevent him from getting strep.

## 2010-11-07 NOTE — Assessment & Plan Note (Signed)
[facility]                            CARDIOLOGY OFFICE NOTE   Jorge Smith, Jorge Smith                      MRN:          [id number]  DATE:10/06/2007                            DOB:          01/25/1933    PRIMARY CARE PHYSICIAN:  Dr. Oliver Barre.   REASON FOR VISIT:  Follow-up cardiac testing.   HISTORY OF PRESENT ILLNESS:  I saw Jorge Smith back in the office in late  February. Given cardiac risk factors and a history of known peripheral  arterial disease as well as shortness of breath, I referred him for a  Myoview.  This study was performed on March 18 and demonstrated no  electrocardiographic changes with adenosine to suggest ischemia.  He had  an inferior wall defect that was likely indicative of soft tissue  attenuation. His ejection fraction was 58% and he was not noted to have  any focal wall motion abnormalities based on echocardiography done in  February either.  He had no frank evidence of ischemia and we reviewed  this today.  His baseline electrocardiogram shows a right bundle branch  block pattern which is old.  We reviewed his medications.  His main  complaint is actually of muscle fatigue and soreness particularly in his  upper body but also in his legs, both anteriorly and posteriorly.  He  seems to feel that this has been getting worse over the last few months.  It may perhaps correlate with some of his new medications and I wonder  about simvastatin.   ALLERGIES:  PENICILLIN.   MEDICATIONS:  1. Aspirin 81 mg p.o. daily.  2. Simvastatin 40 mg p.o. daily.  3. Benazepril 40 mg p.o. daily.  4. Omeprazole 20 mg p.o. daily.  5. Potassium supplements.  6. Selenium.  7. Vitamin C and E.  8. Vitamin B complex.   REVIEW OF SYSTEMS:  As outlined above.  Otherwise negative.   PHYSICAL EXAMINATION:  Blood pressure 140/72, heart rate 60, weight is  176 pounds.  The patient is comfortable and in no acute distress.  NECK:  Reveals no  elevated jugular venous pressure, no loud bruits.  LUNGS:  Clear without labored breathing.  CARDIAC:  Reveals a regular rate and rhythm. No loud murmur or gallop.  EXTREMITIES:  Exhibit no frank pitting edema.   IMPRESSION/RECOMMENDATIONS:  1. Low-risk Myoview as outlined above.  Would anticipate basic risk      factor modification strategies.  He continues to see Dr. Jonny Smith and      has also been evaluated by Dr. Excell Smith with recommendations for      conservative management of peripheral arterial disease and a      walking regimen.  At this point, I do not plan any further cardiac      testing and we will see him back on an as-needed basis from the      perspective of general cardiology.  2. Regarding his muscle soreness, I have asked him to hold his      simvastatin until his scheduled visit with Dr. Jonny Smith on April 30.  This gives him 2 weeks off of the drug and if in fact it is      related, he should hopefully see some improvement over that time.      If on the other hand, his symptoms persist, they may need further      investigation.     Jonelle Sidle, MD  Electronically Signed    SGM/MedQ  DD: 10/06/2007  DT: 10/06/2007  Job #: [id number]   cc:   Corwin Levins, MD

## 2010-11-07 NOTE — Progress Notes (Signed)
[facility]                        PERIPHERAL VASCULAR OFFICE NOTE   KHARTER, BREW                      MRN:          [id number]  DATE:09/10/2007                            DOB:          Jun 05, 1933    REASON FOR CONSULT:  Claudication.   HISTORY OF PRESENT ILLNESS:  Mr. Laswell is a 77 year old gentleman who  was recently evaluated by Dr. Diona Browner for palpitations. At the time of  his evaluation, he admitted to typical symptoms of calf claudication.  He has cramping in his legs involving the calves with minimal symptoms  in the thighs that occur with 10-15 minutes of walking on flat ground  and it occur at a much shorter distance with walking up a hill.  His  mailbox is at the bottom of a hill and he has to stop on his way out to  rest because of leg pain.  He has no rest symptoms and denies any  previous history of ulcerations or nonhealing wounds.  He has no history  of stroke or TIA.  His symptoms have been stable for approximately 1  year.   Mr. Hinderliter underwent an ABI and lower extremity duplex scan on February  24 that showed an ABI of 1.1 on the right and 1.0 on the left. He had  elevated velocities over the superficial femoral arteries more so on the  right than the left. There was evidence of a focal stenosis in the right  SFA as well as a focal stenosis in the left SFA. Arterial wave forms  were brisk and at least biphasic.   CURRENT MEDICATIONS:  1. Aspirin 81 mg.  2. Multivitamins.  3. Selenium.  4. Potassium.  5. Simvastatin 40 mg.  6. Metoprolol 25 mg twice daily.  7. Benazepril 20 mg daily.   ALLERGIES:  PENICILLIN.   PAST MEDICAL HISTORY:  Pertinent for previous inguinal hernia surgery as  well as a cholecystectomy. Recent diagnosis of hypertension as well as  peripheral arterial disease as outlined above. Dyslipidemia.   SOCIAL HISTORY:  The patient is recently separated from his wife. He has  young children. He has  worked in [employer] and lived in  [place] for many years prior to moving to [place]. He works currently  as a Clinical research associate. He was formerly a heavy smoker but quit  in 2000. He drinks alcohol socially. No regular exercise at this time.   FAMILY HISTORY:  There is no history of vascular disease or stroke in  the family. His father had a myocardial infarction in his early 44s.   REVIEW OF SYSTEMS:  A complete 12-point review of systems was performed.  All systems were negative with the exception of a great deal of  emotional stress recently secondary to his separation and sales job.   PHYSICAL EXAMINATION:  The patient is alert and oriented. He is in no  acute distress. He appears younger than his stated age. Weight is 181,  blood pressure 140/72 on the right, 138/72 on the left, heart rate 84,  respirations 16.  HEENT:  Normal.  NECK:  Normal  carotid upstrokes without bruits. Jugular venous pressure  is normal. No thyromegaly or thyroid nodules.  LUNGS:  Clear bilaterally.  HEART:  Regular rate and rhythm without murmurs or gallop. The apex is  discreet and nondisplaced. There are no murmurs.  BACK:  No CVA tenderness.  ABDOMEN:  Soft, nontender. Positive bowel sounds. No abdominal bruits.  EXTREMITIES:  Femoral pulses are 2+ without bruits. There is no  clubbing, cyanosis or edema. Posterior tibial and dorsalis pedis pulses  are 1+ bilaterally.  SKIN:  Warm and dry without rash.  NEUROLOGIC:  Cranial nerves II-XII are intact. Strength is 5/5 and  equal.   ASSESSMENT:  Mr. Channing is a 77 year old gentleman with lower extremity  peripheral arterial disease involving the bilateral superficial femoral  arteries. He has no evidence of inflow disease based on his clinical  exam and lower extremity vascular study. His symptoms are relatively  mild at this time and I would recommend a trial of observation and a  walking program. I discussed a trial of Cilostazol  but he was not  interested. If symptoms remain stable, I think continued observation  would be appropriate. However, if he develops progressive or more  lifestyle limiting symptoms then it would certainly be reasonable to  proceed with angiography and consideration of endovascular treatment. I  advised him that the majority of time claudication symptoms will remain  stable and in 20-30% of patients progressive symptoms will occur. He is  on an excellent medical regimen and no changes in his medication profile  were made today. I would like to see him back in one year with repeat  ABIs. Again if he has progressive symptoms, I would be happy to see him  sooner.     Veverly Fells. Excell Seltzer, MD  Electronically Signed    MDC/MedQ  DD: 09/10/2007  DT: 09/11/2007  Job #: [id number]   cc:   Jonelle Sidle, MD  Corwin Levins, MD

## 2010-11-07 NOTE — Letter (Signed)
August 19, 2007    Corwin Levins, MD  [address]  [place], [place] [address]   RE:  Jorge Smith, Jorge Smith  MRN:  [id number]  /  DOB:  April 25, 1933   Dear Dr. Jonny Ruiz:   Thank you for your referral of Jorge Smith.  As you know, he is a 77-year-  old male with recently diagnosed hypertension and no personal history of  cardiovascular disease.  He reports an intermittent history of rapid  palpitations dating back at least to 2006.  He has lived in [place]  over Jorge last 4 years, having previously resided in [place] for Jorge  preceding 25 years working in [employer].  He states that he  had intermittent sensation of rapid heartbeat associated with a feeling  of indigestion in his neck, very sporadic, and not associated with  exertion.  It sounds as if he was evaluated by Dr. Eldridge Dace with a full  cardiac workup back in 2006.  I presume including a stress test and  echocardiogram.  Jorge Smith states that he was told he had no major  cardiac conditions at that time.  He also wore what I presume was an  event recorder for a period of 1 month and apparently only 2 brief  episodes of some type of dysrhythmia or uncovered at night when Jorge  Smith was asleep.  He states that he was told about vagal maneuvers  and I wonder if he was diagnosed with paroxysmal supraventricular  tachycardia at that time.  He had not had any major recurrence until  January of this year.  States he has had 4 separate episodes of  sometimes prolonged rapid palpitations, Jorge most significant episode  lasting 5 to 6 hours.  He has been under a lot of psychosocial stress  over Jorge last 6 months.  He had no frank dizziness or syncope associated  with this.   In addition to these symptoms, Jorge Smith has felt Jorge general sense of  more breathlessness with activity over Jorge last several years.  He  stopped smoking approximately 9 years ago and gained approximately 20  pounds with this.  He had no frank  exertional chest pain but his stamina  is decreased.  He has not had a stress test for quite some time.  He has  also had leg cramping with activity consistent with claudication and was  in fact referred for lower extremity arterial studies done earlier  today.  Jorge preliminary report of this suggests greater than 50%  bilateral superficial femoral artery stenoses with ABI in Jorge normal  range.  An echocardiogram was also done earlier today demonstrating a  left ventricular ejection fraction approximately 60% with mild ventricle  hypertrophy, mild left atrial enlargement, and evidence of grade 1  diastolic dysfunction.  Aortic valve was trileaflet and mildly calcified  with preserved cusp excursion and mild aortic regurgitation.  No other  major valvular abnormalities were noted.  Jorge final report is pending.  Today's electrocardiogram shows sinus bradycardia at 53 beats per minute  with a right bundle branch block pattern which is old in comparison to a  tracing done through Jorge [place] office.   ALLERGIES:  PENICILLIN.   Present medications include aspirin 81 mg p.o. daily, vitamin C, E, and  B complex, selenium daily, potassium daily, simvastatin 40 mg daily, and  Bystolic 5 mg daily to be changed to Toprol XL 25 mg daily starting  tomorrow.  He also uses ibuprofen p.r.n.  PAST MEDICAL HISTORY:  As outlined above.  Jorge Smith states that he is  been relatively healthy.  He has had 2 hernia surgeries and  cholecystectomy.  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   SOCIAL HISTORY:  Jorge Smith is separated.  He states his wife moved out  this past summer.  He has 6 children.  He previously worked in [employer] and has traveled abroad living in [place] for 25  years.  Now he works as a Clinical research associate.  Has a prior tobacco  use history but quit in January 2000.  Drinks alcohol although denies  heavy use.  No recreational drug use.  He is not  exercising regularly at  this time.   FAMILY HISTORY:  Reviewed.  Jorge Smith states that his father died at  age 22 of having had 3 heart attacks.   REVIEW OF SYSTEMS:  Is outlined above.  Otherwise negative.   EXAMINATION:  Jorge Smith is comfortable, well-nourished no acute  distress.  Blood pressure 181/74 rate is 53, weight 183 pounds.  HEENT:  Conjunctiva lids normal.  Pharynx is clear.  Neck is supple.  No elevated was pressure no loud bruits, thyromegaly.  Lungs are clear without labored breathing at rest.  Cardiac exam reveals a regular rate and rhythm.  No pathologic murmur,  S3 gallop.  No pericardial rub.  ABDOMEN:  Soft, nontender, normoactive bowel sounds.  EXTREMITIES:  Exhibit no significant pitting edema.  Distal pulses are 1-  2+.  SKIN:  Warm and dry.  MUSCULOSKELETAL:  No kyphosis noted.  NEUROPSYCHIATRIC:  Jorge Smith is alert x3.  Affect is appropriate to  situation.   IMPRESSION/RECOMMENDATIONS:  Jorge Smith is a pleasant 78 year old male  with recently diagnosed hypertension, LDL cholesterol of 90 on Zocor,  previous tobacco use history, and some family history of cardiovascular  disease.  Regarding his palpitations it may well be that he has a  paroxysmal supraventricular tachycardia based on available information  and Jorge Smith's description of his prior cardiovascular evaluation.  This is not at all clear, however, and our plan will be to obtain  records from Dr. Eldridge Dace.  We did discuss repeating an event recorder  although Jorge Smith preferred to hold off on this at Jorge present time.  Agree with a trial of beta blocker therapy.  I have also encouraged him  to seek medical attention specifically to get some type of telemetry or  electrocardiographic evaluation if he has a recurrent prolonged episode  of rapid palpitations.  In addition to this, he does have risk factors  for coronary artery disease and has noted a decrease in stamina and some   dyspnea on exertion.  In light of his abnormal lower extremity arterial  studies and risk for atherosclerosis, I think he at least needs a  noninvasive stress test.  Will plan an adenosine Myoview with low level  ambulation.  Referral also be made to Dr. Excell Seltzer for further peripheral  arterial evaluation in light of his clinical claudication.  I will plan  to have him come back Jorge office over Jorge next month for review.    Sincerely,      Jonelle Sidle, MD  Electronically Signed    SGM/MedQ  DD: 08/19/2007  DT: 08/20/2007  Job #: [id number]

## 2010-11-08 ENCOUNTER — Encounter: Payer: Self-pay | Admitting: *Deleted

## 2010-11-08 ENCOUNTER — Encounter: Payer: Self-pay | Admitting: Cardiovascular Disease

## 2010-11-08 ENCOUNTER — Ambulatory Visit (INDEPENDENT_AMBULATORY_CARE_PROVIDER_SITE_OTHER): Payer: Medicare Other | Admitting: Cardiovascular Disease

## 2010-11-08 VITALS — BP 164/62 | HR 66 | Resp 18 | Ht 68.0 in | Wt 174.8 lb

## 2010-11-08 DIAGNOSIS — I739 Peripheral vascular disease, unspecified: Secondary | ICD-10-CM

## 2010-11-08 DIAGNOSIS — I70219 Atherosclerosis of native arteries of extremities with intermittent claudication, unspecified extremity: Secondary | ICD-10-CM

## 2010-11-08 NOTE — Patient Instructions (Signed)
Your physician wants you to follow-up in: 1 YEAR with Dr Excell Seltzer. You will receive a reminder letter in the mail two months in advance. If you don't receive a letter, please call our office to schedule the follow-up appointment.  Your physician has requested that you have a lower extremity arterial duplex. This test is an ultrasound of the arteries in the legs. It looks at arterial blood flow in the legs. Allow one hour for Lower Arterial scans. There are no restrictions or special instructions  Your physician has requested that you have an ankle brachial index (ABI). During this test an ultrasound and blood pressure cuff are used to evaluate the arteries that supply the arms and legs with blood. Allow thirty minutes for this exam. There are no restrictions or special instructions.  Your physician recommends that you continue on your current medications as directed. Please refer to the Current Medication list given to you today.

## 2010-11-15 ENCOUNTER — Ambulatory Visit: Payer: Self-pay | Admitting: Internal Medicine

## 2010-11-16 ENCOUNTER — Other Ambulatory Visit (INDEPENDENT_AMBULATORY_CARE_PROVIDER_SITE_OTHER): Payer: Medicare Other | Admitting: *Deleted

## 2010-11-16 ENCOUNTER — Telehealth: Payer: Self-pay | Admitting: *Deleted

## 2010-11-16 DIAGNOSIS — I499 Cardiac arrhythmia, unspecified: Secondary | ICD-10-CM

## 2010-11-16 DIAGNOSIS — I471 Supraventricular tachycardia: Secondary | ICD-10-CM

## 2010-11-16 LAB — BASIC METABOLIC PANEL
BUN: 17 mg/dL (ref 6–23)
CO2: 24 mEq/L (ref 19–32)
Calcium: 9.3 mg/dL (ref 8.4–10.5)
Chloride: 105 mEq/L (ref 96–112)
Creatinine, Ser: 1.1 mg/dL (ref 0.4–1.5)
Glucose, Bld: 220 mg/dL — ABNORMAL HIGH (ref 70–99)

## 2010-11-16 LAB — CBC WITH DIFFERENTIAL/PLATELET
Basophils Relative: 0 % (ref 0.0–3.0)
HCT: 41 % (ref 39.0–52.0)
Hemoglobin: 13.7 g/dL (ref 13.0–17.0)
Lymphocytes Relative: 5.5 % — ABNORMAL LOW (ref 12.0–46.0)
Lymphs Abs: 0.8 10*3/uL (ref 0.7–4.0)
MCHC: 33.5 g/dL (ref 30.0–36.0)
Monocytes Relative: 2.2 % — ABNORMAL LOW (ref 3.0–12.0)
Neutro Abs: 13.3 10*3/uL — ABNORMAL HIGH (ref 1.4–7.7)
RBC: 4.37 Mil/uL (ref 4.22–5.81)

## 2010-11-16 NOTE — Telephone Encounter (Signed)
Pt here for pre procedure labs. I called and confirmed with EP lab pt scheduled for SVT ablation on Nov 23, 2010. BMP, CBC with diff, PT, PTT ordered.

## 2010-11-21 ENCOUNTER — Encounter: Payer: Self-pay | Admitting: Cardiovascular Disease

## 2010-11-21 ENCOUNTER — Other Ambulatory Visit: Payer: Self-pay | Admitting: Internal Medicine

## 2010-11-21 NOTE — Progress Notes (Signed)
HPI:  This is a 77 year old gentleman presenting for followup evaluation of peripheral arterial disease. The patient has a history of bilateral leg claudication, but his ABIs have been in the normal range. Duplex ultrasounds documented SFA stenosis. The patient has been managed medically.  He continues to have bilateral leg pain with ambulation. His pain occurs in the calf muscles on both sides are affected equally. He feels his leg pain has worsened since evaluation one year ago.  He denies rest pain or ischemic ulceration.  The patient denies chest pain or dyspnea. He has had SVT recently documented on a 12-lead EKG and is under evaluation by Dr. Johney Frame for RF ablation.  Outpatient Encounter Prescriptions as of 11/08/2010  Medication Sig Dispense Refill  . aspirin 81 MG tablet Take 81 mg by mouth daily.        . B Complex-C (SUPER B COMPLEX) TABS Take 1 tablet by mouth daily.        . benazepril (LOTENSIN) 40 MG tablet TAKE 1 TABLET EVERY DAY  90 tablet  2  . Glucosamine-Chondroit-Vit C-Mn (GLUCOSAMINE CHONDROITIN COMPLX) TABS Take 1 tablet by mouth daily.        Marland Kitchen ibuprofen (ADVIL,MOTRIN) 200 MG tablet Take 200 mg by mouth every 6 (six) hours as needed.        . Ibuprofen-Diphenhydramine HCl (ADVIL PM) 200-25 MG CAPS Take 2 tablets by mouth at bedtime.        . Multiple Vitamin (MULTIVITAMIN) tablet Take 1 tablet by mouth daily.        . psyllium (FIBER LAXATIVE) 0.52 G capsule Take 0.52 g by mouth daily.        . simvastatin (ZOCOR) 40 MG tablet Take 40 mg by mouth at bedtime.          Allergies  Allergen Reactions  . Penicillins     Past Medical History  Diagnosis Date  . SVT (supraventricular tachycardia)     long RP  . Hypertension   . Hyperlipidemia   . Chest pain     HX OF  . Diabetes mellitus   . PVD (peripheral vascular disease)     claudication  . Hx of colonic polyps   . GERD (gastroesophageal reflux disease)   . Polymyalgia rheumatica   . BPH (benign prostatic  hypertrophy)   . Low back pain     ROS: Negative except as per HPI  BP 164/62  Pulse 66  Resp 18  Ht 5\' 8"  (1.727 m)  Wt 174 lb 12.8 oz (79.289 kg)  BMI 26.58 kg/m2  PHYSICAL EXAM: Pt is alert and oriented, NAD HEENT: normal Neck: JVP - normal, carotids 2+= without bruits Lungs: CTA bilaterally CV: RRR without murmur or gallop Abd: soft, NT, Positive BS, no hepatomegaly Ext: no C/C/E, distal pulses intact and equal Skin: warm/dry no rash  EKG:  Normal sinus rhythm with right bundle branch block, heart rate 67 beats per minute.  ASSESSMENT AND PLAN:

## 2010-11-21 NOTE — Assessment & Plan Note (Signed)
The patient is on appropriate medical therapy with aspirin, benazepril, and simvastatin. Will repeat ABI duplex scans to reevaluate for progressive lower extremity occlusive disease. Otherwise we'll continue current medical program.

## 2010-11-22 NOTE — Telephone Encounter (Signed)
Pt having ablation on tomorrow. Wants to know will he speak to  Dr. Johney Frame before the procedure.

## 2010-11-22 NOTE — Telephone Encounter (Signed)
Will have to talk with him at the hospital.  Spoke with patient and let him know and will ask Dr Johney Frame tomorrow about the chance of PPM implantion as a risk of the ablation procedure

## 2010-11-23 ENCOUNTER — Telehealth: Payer: Self-pay | Admitting: Internal Medicine

## 2010-11-23 ENCOUNTER — Ambulatory Visit (HOSPITAL_COMMUNITY)
Admission: RE | Admit: 2010-11-23 | Discharge: 2010-11-24 | Disposition: A | Discharge status: Home / Self Care | Payer: Medicare Other | Source: Ambulatory Visit | Attending: Internal Medicine | Admitting: Internal Medicine

## 2010-11-23 DIAGNOSIS — K219 Gastro-esophageal reflux disease without esophagitis: Secondary | ICD-10-CM | POA: Insufficient documentation

## 2010-11-23 DIAGNOSIS — E785 Hyperlipidemia, unspecified: Secondary | ICD-10-CM | POA: Insufficient documentation

## 2010-11-23 DIAGNOSIS — Z860100 Personal history of colon polyps, unspecified: Secondary | ICD-10-CM | POA: Insufficient documentation

## 2010-11-23 DIAGNOSIS — I471 Supraventricular tachycardia: Secondary | ICD-10-CM

## 2010-11-23 DIAGNOSIS — I498 Other specified cardiac arrhythmias: Secondary | ICD-10-CM | POA: Insufficient documentation

## 2010-11-23 DIAGNOSIS — I10 Essential (primary) hypertension: Secondary | ICD-10-CM | POA: Insufficient documentation

## 2010-11-23 DIAGNOSIS — R079 Chest pain, unspecified: Secondary | ICD-10-CM | POA: Insufficient documentation

## 2010-11-23 DIAGNOSIS — I70219 Atherosclerosis of native arteries of extremities with intermittent claudication, unspecified extremity: Secondary | ICD-10-CM | POA: Insufficient documentation

## 2010-11-23 DIAGNOSIS — E119 Type 2 diabetes mellitus without complications: Secondary | ICD-10-CM | POA: Insufficient documentation

## 2010-11-23 HISTORY — PX: ATRIAL ABLATION SURGERY: SHX560

## 2010-11-23 NOTE — Telephone Encounter (Signed)
Spoke with patient Ok to take BP med and ASA prior to procedure

## 2010-11-23 NOTE — Telephone Encounter (Signed)
Patient having ablation today- has question regarding medication .

## 2010-11-24 ENCOUNTER — Telehealth: Payer: Self-pay | Admitting: Internal Medicine

## 2010-11-24 NOTE — Telephone Encounter (Signed)
Discussed with Dr Excell Seltzer okay to play golf on Tues

## 2010-11-24 NOTE — Telephone Encounter (Signed)
Pt is home from his procedure. Pt wants to know when he can play golf. Pt wants to know if he can play Monday or Tuesday.

## 2010-12-04 NOTE — Op Note (Signed)
NAMEJUVENCIO, VERDI NO.:  [id number]  MEDICAL RECORD NO.:  [id number]           PATIENT TYPE:  O  LOCATION:  3735                         FACILITY:  [facility]  PHYSICIAN:  Hillis Range, MD       DATE OF BIRTH:  August 06, 1932  DATE OF PROCEDURE:  11/23/2010 DATE OF DISCHARGE:                              OPERATIVE REPORT   SURGEON:  Hillis Range, MD  PREPROCEDURE DIAGNOSIS:  Supraventricular tachycardia.  POSTPROCEDURE DIAGNOSIS:  Atrioventricular nodal reentrant tachycardia with antegrade conduction of the slow atrioventricular nodal pathway and retrograde conduction over the fast pathway.  PROCEDURES: 1. Comprehensive EP study. 2. Coronary sinus pacing and recording. 3. Mapping of SVT. 4. Ablation of SVT.  INTRODUCTION:  Mr. Agrusa is a pleasant 77 year old gentleman with recurrent symptomatic supraventricular tachycardia who presents today for EP study and radiofrequency ablation.  He has had documented tachycardia which is of a right bundle-branch QRS morphology (his baseline QRS is also right bundle-branch block) with heart rates of 180 beats per minute.  The tachycardia has previously been terminated with carotid massage maneuver.  He presents today for EP study and radiofrequency ablation.  DESCRIPTION OF PROCEDURE:  Informed written consent was obtained and the patient was brought to the electrophysiology lab in the fasting state. He was adequately sedated with intravenous Versed and fentanyl as outlined in the nursing report.  The patient's right neck and groin were prepped and draped in the usual sterile fashion by the EP lab staff. Using a percutaneous Seldinger technique, one 6-French hemostasis sheath was placed in the right internal jugular vein.  A 6-French curved Damato catheter was introduced through the right internal jugular vein and advanced into the coronary sinus for recording and pacing from this location.  Two 6-French and one  8-French hemostasis sheaths were placed in the right common femoral vein.  Two 6-French quadripolar Josephson catheters were introduced through the right common femoral vein and advanced into the right ventricle and His bundle positions for recording and pacing.  The patient presented to the electrophysiology lab in normal sinus rhythm.  His PR interval measured 136 milliseconds with a QRS duration of 132 milliseconds with a right bundle-branch morphology and a QT interval of 442 milliseconds.  His AH interval measured 65 milliseconds with an HV interval of 37 milliseconds.  Ventricular pacing was performed, which revealed midline decremental VA conduction.  With ventricular pacing down to a cycle length of 390 milliseconds, the patient had spontaneous tachycardia.  This was a one-to-one tachycardia with a right bundle-branch surface QRS and a cycle length of 367 milliseconds.  The earliest retrograde activation was found to occur and the His electrode and a VA time measured 200 milliseconds.  The tachycardia spontaneously terminated.  Ventricular extra stimulus testing was performed, which revealed midline decremental VA conduction with no retrograde jumps, echo beats, or tachycardias.  The ventricular ERP was 500/220 milliseconds.  Atrial pacing was performed, which revealed decremental AV conduction with an AV Wenckebach cycle length of 340 milliseconds.  PR was greater than RR and nonsustained tachycardia was observed.  Atrial extra stimulus testing was performed, which revealed decremental  AV conduction with tachycardia induced at 500/325 milliseconds.  This was the same tachycardia as described previously. Ventricular pacing was performed during tachycardia, which revealed a VAV response.  PVCs were then delivered during His refractoriness which did not advance or terminate the tachycardia.  The tachycardia spontaneously terminated.  Atrial extra stimulus testing was again performed  and tachycardia was incessantly produced when pacing of 500/310 milliseconds.  An AH jump was observed with tachycardia easily and reproducibly induced.  The AV nodal ERP was 500/250 milliseconds. Ventricular extra stimulus testing was again performed, which confirmed decremental midline conduction with no retrograde jumps, echo beats, or tachycardias observed.  Atrial extra stimulus testing was again performed, which confirmed decremental AV conduction with tachycardia reproducibly and incessantly produced 500/310 milliseconds.  An AH jump with echo beats was observed 500/270 milliseconds.  The AV nodal ERP was 500/260 milliseconds.  The tachycardia was therefore felt to be classic AV nodal reentrant tachycardia with antegrade conduction over the slow pathway and retrograde conduction over the fast pathway.  I therefore elected to perform slow pathway ablation.  A 7-French [place] scientific blazer 4-mm ablation catheter was introduced through the right common femoral vein and advanced into the right atrium.  Mapping of Alycia Rossetti triangle revealed a moderate-sized Designer, industrial/product.  A series of radiofrequency applications were delivered between sites of 10 and 6 in [place] triangle.  Accelerated junctional rhythm was observed with intact VA conduction.  Following ablation, atrial extra stimulus testing was again performed, which revealed decremental AV conduction with an AH jump and double echo beats observed.  I therefore delivered a final radiofrequency application at between sites 6 and 3 in [place] triangle with target temperature of 60 degrees at 50 watts.  Again, accelerated junctional rhythm was observed with intact VA conduction.  The lesion was delivered for 30 seconds.  Following ablation, atrial extra stimulus testing was again performed.  This revealed a single AH jump, but no echo beats and no tachycardias observed.  The patient had expressed prior to the procedure an extreme concern about  the possibility of pacemaker dependence and requested that aggressive measures not be performed.  I therefore felt at this point that it was prudent to stop the procedure.  As the patient had no further tachycardia and only single AH jump with no echo beats, it was felt that adequate slow pathway modification had been performed.  Following ablation, atrial pacing was performed, which revealed no evidence of PR greater than RR and an AV Wenckebach cycle length of 340 milliseconds.  Following ablation, the AH interval measured 73 milliseconds with an HV interval of 41 milliseconds.  The procedure was therefore considered completed. All catheters were removed and the sheaths were aspirated and flushed. The sheaths were removed and hemostasis was assured.  There were no early apparent complications.  CONCLUSIONS: 1. Sinus rhythm upon presentation. 2. Easily inducible and incessant AV nodal reentrant tachycardia. 3. Successful slow pathway modification with no inducible arrhythmias     following ablation. 4. No early apparent complications.     Hillis Range, MD     JA/MEDQ  D:  11/23/2010  T:  11/24/2010  Job:  [id number]  cc:   Veverly Fells. Excell Seltzer, MD Gordy Savers, MD  Electronically Signed by Hillis Range MD on 12/04/2010 09:19:39 AM

## 2010-12-05 ENCOUNTER — Telehealth: Payer: Self-pay | Admitting: Internal Medicine

## 2010-12-05 NOTE — Telephone Encounter (Signed)
Ok to have caffeine sparingly  Patient aware

## 2010-12-05 NOTE — Telephone Encounter (Signed)
Pt called and stated he had heart ablation and wants to know if he can have his coffee with caffene or a soda once in a while?  Please let patient know.

## 2010-12-13 ENCOUNTER — Encounter: Payer: Self-pay | Admitting: Cardiovascular Disease

## 2010-12-13 ENCOUNTER — Encounter (INDEPENDENT_AMBULATORY_CARE_PROVIDER_SITE_OTHER): Payer: Medicare Other | Admitting: Cardiology

## 2010-12-13 DIAGNOSIS — I739 Peripheral vascular disease, unspecified: Secondary | ICD-10-CM

## 2010-12-13 DIAGNOSIS — I70219 Atherosclerosis of native arteries of extremities with intermittent claudication, unspecified extremity: Secondary | ICD-10-CM

## 2011-01-03 ENCOUNTER — Telehealth: Payer: Self-pay | Admitting: *Deleted

## 2011-01-03 NOTE — Telephone Encounter (Signed)
Spoke with pt and gave him results of recent lower extremity doppler studies. He is requesting I check with Dr. Excell Seltzer to see if he should be started on Cilostazol again.  Pt had taken in past and it was discontinued. Pt states he continues to have pain in legs. Will forward to Dr. Excell Seltzer for review.

## 2011-01-04 ENCOUNTER — Encounter: Payer: Self-pay | Admitting: Internal Medicine

## 2011-01-05 ENCOUNTER — Other Ambulatory Visit: Payer: Self-pay | Admitting: *Deleted

## 2011-01-05 DIAGNOSIS — I739 Peripheral vascular disease, unspecified: Secondary | ICD-10-CM

## 2011-01-05 MED ORDER — CILOSTAZOL 100 MG PO TABS: 100.0000 mg | ORAL_TABLET | Freq: Two times a day (BID) | ORAL | Status: DC

## 2011-01-05 NOTE — Telephone Encounter (Signed)
Recommend cilostazol 50 mg twice daily x 2 weeks, then 100 mg twice daily.

## 2011-01-08 ENCOUNTER — Encounter: Payer: Medicare Other | Admitting: Internal Medicine

## 2011-01-09 ENCOUNTER — Telehealth: Payer: Self-pay | Admitting: Cardiovascular Disease

## 2011-01-09 DIAGNOSIS — I739 Peripheral vascular disease, unspecified: Secondary | ICD-10-CM

## 2011-01-09 MED ORDER — CILOSTAZOL 100 MG PO TABS: 100.0000 mg | ORAL_TABLET | Freq: Two times a day (BID) | ORAL | Status: DC

## 2011-01-09 NOTE — Telephone Encounter (Signed)
Per pt call. Pt picked up RX. Rx was made out for 1 month supply, pt need 3 month supply. Rx: Cilostazol.  Pt was not happy about having to pay for 1 month supply and then having to go back to pick up RX for additional 3 months. Pt complaining that it is a waste of money to get a one month supply of RX. Pt requested that RX be called in for 3 month supply from now on out. Please return pt call to advise.

## 2011-01-09 NOTE — Telephone Encounter (Signed)
Spoke with pt. Will call in 3 month supply of Cilostazol to [facility] on [address].

## 2011-01-09 NOTE — Telephone Encounter (Signed)
Pt notified. See phone note January 09, 2011 for more information.

## 2011-01-11 NOTE — Discharge Summary (Signed)
NAMEJEVAN, Jorge Smith NO.:  [id number]  MEDICAL RECORD NO.:  [id number]           PATIENT TYPE:  O  LOCATION:  3735                         FACILITY:  [facility]  PHYSICIAN:  Doylene Canning. Ladona Ridgel, MD    DATE OF BIRTH:  1933/05/27  DATE OF ADMISSION:  11/23/2010 DATE OF DISCHARGE:  11/24/2010                              DISCHARGE SUMMARY   PRIMARY CARE PHYSICIAN:  Gordy Savers, MD  PRIMARY CARDIOLOGIST:  Veverly Fells. Excell Seltzer, MD  ELECTROPHYSIOLOGIST:  Hillis Range, MD  PRIMARY DIAGNOSIS:  Ventricular tachycardia.  SECONDARY DIAGNOSES: 1. Hypertension. 2. Hyperlipidemia. 3. History of chest pain status post Myoview in March 2009 showing     inferior attenuation with ejection fraction of 58% felt to be a low     risk study. 4. Diabetes. 5. Peripheral vascular disease/claudication. 6. History of colon polyps. 7. Gastroesophageal reflux disease. 8. History of carpal tunnel surgery in 2010. 9. Benign prostatic hypertrophy. 10.Status post hernia repair x2. 11.Status post cholecystectomy.  ALLERGIES:  The patient is allergic to PENICILLIN.  PROCEDURES IN THIS ADMISSION:  Electrophysiological study and radiofrequency catheter ablation of SVT on Nov 23, 2010, by Dr. Johney Frame. This study demonstrated sinus rhythm upon presentation, easily inducible and incessant AVNRT.  Successful slow pathway modification with no inducible arrhythmia following ablation.  No other apparent complications.  BRIEF HISTORY OF PRESENT ILLNESS:  Jorge Smith is a 77 year old male with recurrent supraventricular tachycardia that has been responsive to adenosine.  He was evaluated by Dr. Johney Frame for treatment options. Risks, benefits, and alternatives of catheter ablation were discussed with the patient.  He wished to proceed.  HOSPITAL COURSE:  The patient on Nov 23, 2010,  underwent ablation of SVT.  This was carried out with details outlined above.  He was monitored on telemetry  overnight, which demonstrated sinus rhythm.  On June 1,  his groin and neck incisions were without hematoma or bruits. He was evaluated by Dr. Ladona Ridgel  and considered to be stable for discharge.  FOLLOWUP APPOINTMENT: 1. Dr. Johney Frame on January 08, 2011, at 10:15 a.m. 2. Dr. Excell Seltzer as scheduled. 3. Dr. Amador Cunas as scheduled.  DISCHARGE INSTRUCTIONS: 1. Increase activity slowly. 2. No driving for 2 days. 3. Follow a low-sodium heart-healthy diet. 4. Keep incisions clean and dry.  DISCHARGE MEDICATIONS: 1. Advil PM 2 capsules daily at bedtime as needed. 2. Aleve 2 tablets daily as needed. 3. Aspirin 81 mg daily. 4. Benazepril 40 mg daily. 5. Fiber 2 tablets twice daily. 6. Glucosamine and chondroitin daily. 7. Ibuprofen 200 mg 4 tablets daily as needed. 8. Multivitamin daily. 9. Simvastatin 40 mg daily. 10.Cipro B complex daily.  DISPOSITION:  The patient was seen and examined by Dr. Ladona Ridgel on November 24, 2010, and was stable for discharge.  DURATION OF DISCHARGE ENCOUNTER:  35 minutes.     Gypsy Balsam, RN,BSN  ______________________________ Doylene Canning. Ladona Ridgel, MD    AS/MEDQ  D:  11/24/2010  T:  11/24/2010  Job:  [id number]  cc:   Veverly Fells. Excell Seltzer, MD Gordy Savers, MD  Electronically Signed by Gypsy Balsam RNBSN on 12/01/2010 03:13:16 PM Electronically Signed  by Lewayne Bunting MD on 01/11/2011 08:30:26 AM

## 2011-01-19 ENCOUNTER — Encounter: Payer: Self-pay | Admitting: Internal Medicine

## 2011-01-24 ENCOUNTER — Encounter: Payer: Self-pay | Admitting: Internal Medicine

## 2011-01-24 ENCOUNTER — Ambulatory Visit (INDEPENDENT_AMBULATORY_CARE_PROVIDER_SITE_OTHER): Payer: Medicare Other | Admitting: Internal Medicine

## 2011-01-24 VITALS — BP 130/70 | HR 69 | Ht 68.0 in | Wt 176.0 lb

## 2011-01-24 DIAGNOSIS — I471 Supraventricular tachycardia: Secondary | ICD-10-CM

## 2011-01-24 NOTE — Patient Instructions (Signed)
Your physician recommends that you schedule a follow-up appointment in: as needed  

## 2011-01-24 NOTE — Assessment & Plan Note (Signed)
Doing well s/p slow pathway ablation for AVNRT No changes today  Return prn

## 2011-01-24 NOTE — Progress Notes (Signed)
The patient presents today for routine electrophysiology followup.  Since his ablation, he has done well.  He has had no further SVT.  Today, he denies symptoms of palpitations, chest pain, shortness of breath, orthopnea, PND, lower extremity edema, dizziness, presyncope, syncope, or neurologic sequela.  The patient feels that he is tolerating medications without difficulties and is otherwise without complaint today.   Past Medical History  Diagnosis Date  . 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 Hypertension   . Hyperlipidemia   . Chest pain     HX OF  . Diabetes mellitus   . PVD (peripheral vascular disease)     claudication  . Hx of colonic polyps   . GERD (gastroesophageal reflux disease)   . Polymyalgia rheumatica   . BPH (benign prostatic hypertrophy)   . Low back pain    Past Surgical History  Procedure Date  . Carpal tunnel release 2010  . Hernia repair     x 2  . Cholecystectomy   . Total hip arthroplasty 2011    complicated by postop ilieus  . Atrial ablation surgery 11/23/10    slow pathway modification for AVNRT by JA    Current Outpatient Prescriptions  Medication Sig Dispense Refill  . aspirin 81 MG tablet Take 81 mg by mouth daily.        . B Complex-C (SUPER B COMPLEX) TABS Take 1 tablet by mouth daily.        . benazepril (LOTENSIN) 40 MG tablet TAKE 1 TABLET EVERY DAY  90 tablet  2  . cilostazol (PLETAL) 100 MG tablet Take 1 tablet (100 mg total) by mouth 2 (two) times daily.  180 tablet  3  . Glucosamine-Chondroit-Vit C-Mn (GLUCOSAMINE CHONDROITIN COMPLX) TABS Take 1 tablet by mouth daily.        Marland Kitchen ibuprofen (ADVIL,MOTRIN) 200 MG tablet Take 200 mg by mouth every 6 (six) hours as needed.        . Ibuprofen-Diphenhydramine HCl (ADVIL PM) 200-25 MG CAPS Take 2 tablets by mouth at bedtime.        . Multiple Vitamin (MULTIVITAMIN) tablet Take 1 tablet by mouth daily.        . NON FORMULARY Over the counter potassium        . psyllium (FIBER LAXATIVE) 0.52 G capsule Take 0.52 g by mouth daily.        . simvastatin (ZOCOR) 40 MG tablet TAKE 1 TABLET EVERY DAY  90 tablet  3    Allergies  Allergen Reactions  . Penicillins     History   Social History  . Marital Status: Divorced    Spouse Name: N/A    Number of Children: 2  . Years of Education: N/A   Occupational History  . furniture sales    Social History Main Topics  . Smoking status: Former Smoker    Types: Cigarettes    Quit date: 06/25/2008  . Smokeless tobacco: Not on file  . Alcohol Use: No     one 3-4 times per week  . Drug Use: Not on file  . Sexually Active: Not on file   Other Topics Concern  . Not on file   Social History Narrative  . No narrative on file    Family History  Problem Relation Age of Onset  . Other Mother 20    died in a fire  . Heart attack Father 20  . Heart attack  Other 93    paternal grandfather  . Other Sister 66    had nephrectomy alive and well   Physical Exam: Filed Vitals:   01/24/11 0955  BP: 130/70  Pulse: 69  Height: 5\' 8"  (1.727 m)  Weight: 176 lb (79.833 kg)    GEN- The patient is well appearing, alert and oriented x 3 today.   Head- normocephalic, atraumatic Eyes-  Sclera clear, conjunctiva pink Ears- hearing intact Oropharynx- clear Neck- supple, no JVP Lymph- no cervical lymphadenopathy Lungs- Clear to ausculation bilaterally, normal work of breathing Heart- Regular rate and rhythm, no murmurs, rubs or gallops, PMI not laterally displaced GI- soft, NT, ND, + BS Extremities- no clubbing, cyanosis, or edema MS- no significant deformity or atrophy Skin- no rash or lesion Psych- euthymic mood, full affect Neuro- strength and sensation are intact  ekg today reveals sinus rhythm 69 bpm, RBBB  Assessment and Plan:

## 2011-02-05 ENCOUNTER — Encounter: Payer: Self-pay | Admitting: Internal Medicine

## 2011-05-15 ENCOUNTER — Other Ambulatory Visit: Payer: Self-pay | Admitting: Internal Medicine

## 2011-06-27 ENCOUNTER — Telehealth: Payer: Self-pay

## 2011-06-27 NOTE — Telephone Encounter (Signed)
Ok  IF  slot available

## 2011-06-27 NOTE — Telephone Encounter (Signed)
Pt request to have CPE this month and pt would like to have lab work before CPE appt.  Pt would like to have labs done this week. Pls advise.

## 2011-07-04 NOTE — Telephone Encounter (Signed)
Pt is sch for cpx on 08-01-11 845am and cpx labs 07-26-11 915am

## 2011-07-26 ENCOUNTER — Other Ambulatory Visit (INDEPENDENT_AMBULATORY_CARE_PROVIDER_SITE_OTHER): Payer: Medicare Other

## 2011-07-26 DIAGNOSIS — I10 Essential (primary) hypertension: Secondary | ICD-10-CM

## 2011-07-26 DIAGNOSIS — Z125 Encounter for screening for malignant neoplasm of prostate: Secondary | ICD-10-CM

## 2011-07-26 DIAGNOSIS — Z0000 Encounter for general adult medical examination without abnormal findings: Secondary | ICD-10-CM

## 2011-07-26 LAB — BASIC METABOLIC PANEL
BUN: 15 mg/dL (ref 6–23)
CO2: 25 mEq/L (ref 19–32)
Chloride: 106 mEq/L (ref 96–112)
GFR: 78.84 mL/min (ref >60.00)
Glucose, Bld: 121 mg/dL — ABNORMAL HIGH (ref 70–99)
Potassium: 4.5 mEq/L (ref 3.5–5.1)
Sodium: 140 mEq/L (ref 135–145)

## 2011-07-26 LAB — HEPATIC FUNCTION PANEL
AST: 19 U/L (ref 0–37)
Albumin: 3.9 g/dL (ref 3.5–5.2)

## 2011-07-26 LAB — CBC WITH DIFFERENTIAL/PLATELET
Basophils Absolute: 0.1 10*3/uL (ref 0.0–0.1)
HCT: 40.4 % (ref 39.0–52.0)
Hemoglobin: 13.4 g/dL (ref 13.0–17.0)
Lymphs Abs: 2 10*3/uL (ref 0.7–4.0)
MCHC: 33.1 g/dL (ref 30.0–36.0)
MCV: 93.9 fl (ref 78.0–100.0)
Monocytes Absolute: 0.8 10*3/uL (ref 0.1–1.0)
Monocytes Relative: 7.8 % (ref 3.0–12.0)
Neutro Abs: 6.5 10*3/uL (ref 1.4–7.7)
RDW: 16.4 % — ABNORMAL HIGH (ref 11.5–14.6)

## 2011-07-26 LAB — POCT URINALYSIS DIPSTICK
Glucose, UA: NEGATIVE
Ketones, UA: NEGATIVE
Protein, UA: NEGATIVE
Spec Grav, UA: 1.02

## 2011-07-26 LAB — PSA: PSA: 2.31 ng/mL (ref 0.10–4.00)

## 2011-07-26 LAB — LIPID PANEL
Cholesterol: 200 mg/dL (ref 0–200)
Triglycerides: 192 mg/dL — ABNORMAL HIGH (ref 0.0–149.0)
VLDL: 38.4 mg/dL (ref 0.0–40.0)

## 2011-07-26 LAB — TSH: TSH: 0.8 u[IU]/mL (ref 0.35–5.50)

## 2011-08-01 ENCOUNTER — Ambulatory Visit (INDEPENDENT_AMBULATORY_CARE_PROVIDER_SITE_OTHER): Payer: Medicare Other | Admitting: Internal Medicine

## 2011-08-01 ENCOUNTER — Encounter: Payer: Self-pay | Admitting: Internal Medicine

## 2011-08-01 VITALS — BP 128/70 | HR 92 | Temp 97.6°F | Ht 68.0 in | Wt 176.0 lb

## 2011-08-01 DIAGNOSIS — N400 Enlarged prostate without lower urinary tract symptoms: Secondary | ICD-10-CM

## 2011-08-01 DIAGNOSIS — I10 Essential (primary) hypertension: Secondary | ICD-10-CM

## 2011-08-01 DIAGNOSIS — Z0000 Encounter for general adult medical examination without abnormal findings: Secondary | ICD-10-CM

## 2011-08-01 DIAGNOSIS — I70219 Atherosclerosis of native arteries of extremities with intermittent claudication, unspecified extremity: Secondary | ICD-10-CM

## 2011-08-01 DIAGNOSIS — I739 Peripheral vascular disease, unspecified: Secondary | ICD-10-CM

## 2011-08-01 MED ORDER — SIMVASTATIN 40 MG PO TABS: 40.0000 mg | ORAL_TABLET | Freq: Every day | ORAL | Status: DC

## 2011-08-01 MED ORDER — CILOSTAZOL 100 MG PO TABS: 100.0000 mg | ORAL_TABLET | Freq: Two times a day (BID) | ORAL | Status: DC

## 2011-08-01 MED ORDER — BENAZEPRIL HCL 40 MG PO TABS: 40.0000 mg | ORAL_TABLET | Freq: Every day | ORAL | Status: DC

## 2011-08-01 NOTE — Progress Notes (Signed)
Subjective:    Patient ID: Jorge Smith, male    DOB: March 09, 1933, 78 y.o.   MRN: [id number]  HPI  CC: cpx - doing well and labs done.  History of Present Illness:   78 year-old patient seen today for a comprehensive evaluation. medical problems include BPH, history of polymyalgia rheumatica, impaired glucose tolerance, hypertension, and dyslipidemia. He has a history PA D., which has been stable. He has osteoarthritis and is status post right hip replacement. He is followed by a number of consultants.  He is scheduled for a urology followup next month and also has a high examination scheduled. He is followed by cardiology and is status post ablation.  Past Medical History  Diagnosis Date  . 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 Hypertension   . Hyperlipidemia   . Chest pain     HX OF  . Diabetes mellitus   . PVD (peripheral vascular disease)     claudication  . Hx of colonic polyps   . GERD (gastroesophageal reflux disease)   . Polymyalgia rheumatica   . BPH (benign prostatic hypertrophy)   . Low back pain     History   Social History  . Marital Status: Divorced    Spouse Name: N/A    Number of Children: 2  . Years of Education: N/A   Occupational History  . furniture sales    Social History Main Topics  . Smoking status: Former Smoker    Types: Cigarettes    Quit date: 06/25/2008  . Smokeless tobacco: Not on file  . Alcohol Use: No     one 3-4 times per week  . Drug Use: Not on file  . Sexually Active: Not on file   Other Topics Concern  . Not on file   Social History Narrative  . No narrative on file    Past Surgical History  Procedure Date  . Carpal tunnel release 2010  . Hernia repair     x 2  . Cholecystectomy   . Total hip arthroplasty 2011    complicated by postop ilieus  . Atrial ablation surgery 11/23/10    slow pathway modification for AVNRT by JA    Family History  Problem Relation Age  of Onset  . Other Mother 37    died in a fire  . Heart attack Father 66  . Heart attack Other 32    paternal grandfather  . Other Sister 27    had nephrectomy alive and well    Allergies  Allergen Reactions  . Penicillins     Current Outpatient Prescriptions on File Prior to Visit  Medication Sig Dispense Refill  . aspirin 81 MG tablet Take 81 mg by mouth daily.        . B Complex-C (SUPER B COMPLEX) TABS Take 1 tablet by mouth daily.        . benazepril (LOTENSIN) 40 MG tablet TAKE 1 TABLET EVERY DAY  90 tablet  0  . cilostazol (PLETAL) 100 MG tablet Take 1 tablet (100 mg total) by mouth 2 (two) times daily.  180 tablet  3  . Glucosamine-Chondroit-Vit C-Mn (GLUCOSAMINE CHONDROITIN COMPLX) TABS Take 1 tablet by mouth daily.        Marland Kitchen ibuprofen (ADVIL,MOTRIN) 200 MG tablet Take 200 mg by mouth every 6 (six) hours as needed.        . Ibuprofen-Diphenhydramine HCl (ADVIL PM) 200-25 MG CAPS Take 2  tablets by mouth at bedtime.        . Multiple Vitamin (MULTIVITAMIN) tablet Take 1 tablet by mouth daily.        . NON FORMULARY Over the counter potassium       . psyllium (FIBER LAXATIVE) 0.52 G capsule Take 0.52 g by mouth daily.        . simvastatin (ZOCOR) 40 MG tablet TAKE 1 TABLET EVERY DAY  90 tablet  3    BP 128/70  Pulse 92  Temp(Src) 97.6 F (36.4 C) (Oral)  Ht 5\' 8"  (1.727 m)  Wt 176 lb (79.833 kg)  BMI 26.76 kg/m2  SpO2 98%      1. Risk factors, based on past  M,S,F history-  cardiovascular risk factors include hypertension dyslipidemia and impaired glucose tolerance  2.  Physical activities: Remains quite active but limited somewhat due to her left hip pain and mild claudication. He continues to work a 40 hour work week  3.  Depression/mood: History depression or mood disorder  4.  Hearing: No major deficits  5.  ADL's: Independent in all aspects of daily living  6.  Fall risk: Low  7.  Home safety: No problems identified  8.  Height weight, and visual  acuity; height and weight stable uses soft contacts  9.  Counseling: regular exercise heart healthy diet all encouraged  10. Lab orders based on risk factors: Laboratory profile discussed  11. Referral : We'll continue to follow with cardiology ophthalmology urology and orthopedics  12. Care plan: Continue present regimen  13. Cognitive assessment: Alert and oriented with normal affect no cognitive dysfunction     Allergies:  1) ! Penicillin   Past History:  Past Medical History:   E.D.  GERD  Hyperlipidemia  arrythmia 2006  Low back pain  lumbar disc disease ([place])  hx of alcohol abuse  Peripheral vascular disease  Diabetes mellitus, type II - diet  Hypertension  Benign prostatic hypertrophy ( [place])  PMR ([place])  carpal tunnel syndrome  Colonic polyps, hx of  RBBB   Past Surgical History:  hernia x 2  Cholecystectomy  Carpal tunnel (Bilateral) 2010  colonoscopy 2007   Family History:  Reviewed history from 08/29/2008 and no changes required.  Father - MI in his 66's  No PAD or Stroke in the family   Social History:  Reviewed history from 07/05/2008 and no changes required.   Former Smoker discontinued and 10 years ago  Alcohol use-yes  Married but lives apart- third wife  6 children- ages 26 through 44  work - Control and instrumentation engineer  has lived in [place] for 25 years    Review of Systems  Constitutional: Negative for fever, chills, activity change, appetite change and fatigue.  HENT: Negative for hearing loss, ear pain, congestion, rhinorrhea, sneezing, mouth sores, trouble swallowing, neck pain, neck stiffness, dental problem, voice change, sinus pressure and tinnitus.   Eyes: Negative for photophobia, pain, redness and visual disturbance.  Respiratory: Negative for apnea, cough, choking, chest tightness, shortness of breath and wheezing.   Cardiovascular: Negative for chest pain, palpitations and leg swelling.  Gastrointestinal: Negative for nausea,  vomiting, abdominal pain, diarrhea, constipation, blood in stool, abdominal distention, anal bleeding and rectal pain.  Genitourinary: Negative for dysuria, urgency, frequency, hematuria, flank pain, decreased urine volume, discharge, penile swelling, scrotal swelling, difficulty urinating, genital sores and testicular pain.  Musculoskeletal: Positive for gait problem (mild claudication and left hip pain). Negative for myalgias, back pain, joint swelling and  arthralgias.  Skin: Negative for color change, rash and wound.  Neurological: Negative for dizziness, tremors, seizures, syncope, facial asymmetry, speech difficulty, weakness, light-headedness, numbness and headaches.  Hematological: Negative for adenopathy. Does not bruise/bleed easily.  Psychiatric/Behavioral: Negative for suicidal ideas, hallucinations, behavioral problems, confusion, sleep disturbance, self-injury, dysphoric mood, decreased concentration and agitation. The patient is not nervous/anxious.        Objective:   Physical Exam  Constitutional: He appears well-developed and well-nourished.  HENT:  Head: Normocephalic and atraumatic.  Right Ear: External ear normal.  Left Ear: External ear normal.  Nose: Nose normal.  Mouth/Throat: Oropharynx is clear and moist.  Eyes: Conjunctivae and EOM are normal. Pupils are equal, round, and reactive to light. No scleral icterus.  Neck: Normal range of motion. Neck supple. No JVD present. No thyromegaly present.  Cardiovascular: Regular rhythm, normal heart sounds and intact distal pulses.  Exam reveals no gallop and no friction rub.   No murmur heard.      Pedal pulses are palpable  Pulmonary/Chest: Effort normal. He has rales. He exhibits no tenderness.       A few bibasilar crackles  Abdominal: Soft. Bowel sounds are normal. He exhibits no distension and no mass. There is no tenderness.  Genitourinary: Prostate normal and penis normal.  Musculoskeletal: Normal range of motion. He  exhibits no edema and no tenderness.  Lymphadenopathy:    He has no cervical adenopathy.  Neurological: He is alert. He has normal reflexes. No cranial nerve deficit. Coordination normal.  Skin: Skin is warm and dry. No rash noted.  Psychiatric: He has a normal mood and affect. His behavior is normal.          Assessment & Plan:    Preventive health examination  Impaired glucose tolerance BPH with ED Hypertension controlled Mild claudication  We'll continue present regimen Medicines refilled Recheck in one year or as needed

## 2011-08-01 NOTE — Patient Instructions (Signed)
Limit your sodium (Salt) intake    It is important that you exercise regularly, at least 20 minutes 3 to 4 times per week.  If you develop chest pain or shortness of breath seek  medical attention.  Return in one year for follow-up   

## 2011-08-24 ENCOUNTER — Ambulatory Visit (INDEPENDENT_AMBULATORY_CARE_PROVIDER_SITE_OTHER): Payer: Medicare Other | Admitting: Internal Medicine

## 2011-08-24 ENCOUNTER — Encounter: Payer: Self-pay | Admitting: Internal Medicine

## 2011-08-24 DIAGNOSIS — J40 Bronchitis, not specified as acute or chronic: Secondary | ICD-10-CM

## 2011-08-24 DIAGNOSIS — I10 Essential (primary) hypertension: Secondary | ICD-10-CM

## 2011-08-24 MED ORDER — HYDROCODONE-HOMATROPINE 5-1.5 MG/5ML PO SYRP: 5.0000 mL | ORAL_SOLUTION | Freq: Four times a day (QID) | ORAL | Status: DC | PRN

## 2011-08-24 MED ORDER — HYDROCODONE-HOMATROPINE 5-1.5 MG/5ML PO SYRP: 5.0000 mL | ORAL_SOLUTION | Freq: Four times a day (QID) | ORAL | Status: AC | PRN

## 2011-08-24 MED ORDER — AZITHROMYCIN 250 MG PO TABS: ORAL_TABLET | ORAL | Status: AC

## 2011-08-24 NOTE — Progress Notes (Signed)
  Subjective:    Patient ID: Jorge Smith, male    DOB: 03/11/1933, 78 y.o.   MRN: [id number]  HPI  78 year old patient  who presents with a three-day history of cough chest congestion malaise and general sense of wellness. He has had no documented fever but at times has had chills. Cough is minimally productive. He does have a history of tobacco use and discontinued in 2010. No wheezing no significant shortness of breath or chest pain    Review of Systems  Constitutional: Positive for chills, activity change and appetite change. Negative for fever and fatigue.  HENT: Negative for hearing loss, ear pain, congestion, sore throat, trouble swallowing, neck stiffness, dental problem, voice change and tinnitus.   Eyes: Negative for pain, discharge and visual disturbance.  Respiratory: Positive for cough. Negative for chest tightness, wheezing and stridor.   Cardiovascular: Negative for chest pain, palpitations and leg swelling.  Gastrointestinal: Negative for nausea, vomiting, abdominal pain, diarrhea, constipation, blood in stool and abdominal distention.  Genitourinary: Negative for urgency, hematuria, flank pain, discharge, difficulty urinating and genital sores.  Musculoskeletal: Negative for myalgias, back pain, joint swelling, arthralgias and gait problem.  Skin: Negative for rash.  Neurological: Negative for dizziness, syncope, speech difficulty, weakness, numbness and headaches.  Hematological: Negative for adenopathy. Does not bruise/bleed easily.  Psychiatric/Behavioral: Negative for behavioral problems and dysphoric mood. The patient is not nervous/anxious.        Objective:   Physical Exam  Constitutional: He is oriented to person, place, and time. He appears well-developed.  HENT:  Head: Normocephalic.  Right Ear: External ear normal.  Left Ear: External ear normal.  Eyes: Conjunctivae and EOM are normal.  Neck: Normal range of motion.  Cardiovascular: Normal rate and  normal heart sounds.   Pulmonary/Chest: Effort normal. No respiratory distress. He has no wheezes. He has rales.       Extensive rales especially on the left  Abdominal: Bowel sounds are normal.  Musculoskeletal: Normal range of motion. He exhibits no edema and no tenderness.  Neurological: He is alert and oriented to person, place, and time.  Psychiatric: He has a normal mood and affect. His behavior is normal.          Assessment & Plan:   URI. Possible left lower lobe community acquired pneumonia. History tobacco use and COPD. We'll treat symptomatically with Hydromet. In addition we'll treat with azithromycin. He will call if he develops any worsening symptoms.

## 2011-08-24 NOTE — Patient Instructions (Signed)
Get plenty of rest, Drink lots of  clear liquids, and use Tylenol or ibuprofen for fever and discomfort.    Take your antibiotic as prescribed until ALL of it is gone, but stop if you develop a rash, swelling, or any side effects of the medication.  Contact our office as soon as possible if  there are side effects of the medication.  Call or return to clinic prn if these symptoms worsen or fail to improve as anticipated.   

## 2011-08-31 ENCOUNTER — Encounter: Payer: Self-pay | Admitting: Internal Medicine

## 2011-08-31 ENCOUNTER — Ambulatory Visit (INDEPENDENT_AMBULATORY_CARE_PROVIDER_SITE_OTHER): Payer: Medicare Other | Admitting: Internal Medicine

## 2011-08-31 DIAGNOSIS — I10 Essential (primary) hypertension: Secondary | ICD-10-CM

## 2011-08-31 DIAGNOSIS — E119 Type 2 diabetes mellitus without complications: Secondary | ICD-10-CM

## 2011-08-31 DIAGNOSIS — J069 Acute upper respiratory infection, unspecified: Secondary | ICD-10-CM

## 2011-08-31 NOTE — Progress Notes (Signed)
  Subjective:    Patient ID: Jorge Smith, male    DOB: [birth year], 78 y.o.   MRN: [id number]  HPI  78 year old patient who is seen today for followup. He has a history of hypertension and diabetes and was seen 7 days ago with a upper start her tract infection. He had extensive rales involving his left lower lung field and is completed azithromycin antibiotic therapy. He is much improved with only mild nonproductive cough he has been back to work this past week and had cataract surgery 2 days ago. He is a former smoker and discontinued in 2000 (not 2010 as dictated in prior note). No fever or shortness of breath He did have a chest x-ray and subsequent chest CT in July 2011 that revealed bilateral lower lobe infiltrates. A chest x-ray earlier in 2011 and revealed no active cardiopulmonary disease    Review of Systems  Constitutional: Negative for fever, chills, appetite change and fatigue.  HENT: Negative for hearing loss, ear pain, congestion, sore throat, trouble swallowing, neck stiffness, dental problem, voice change and tinnitus.   Eyes: Negative for pain, discharge and visual disturbance.  Respiratory: Positive for cough. Negative for chest tightness, wheezing and stridor.   Cardiovascular: Negative for chest pain, palpitations and leg swelling.  Gastrointestinal: Negative for nausea, vomiting, abdominal pain, diarrhea, constipation, blood in stool and abdominal distention.  Genitourinary: Negative for urgency, hematuria, flank pain, discharge, difficulty urinating and genital sores.  Musculoskeletal: Negative for myalgias, back pain, joint swelling, arthralgias and gait problem.  Skin: Negative for rash.  Neurological: Negative for dizziness, syncope, speech difficulty, weakness, numbness and headaches.  Hematological: Negative for adenopathy. Does not bruise/bleed easily.  Psychiatric/Behavioral: Negative for behavioral problems and dysphoric mood. The patient is not nervous/anxious.         Objective:   Physical Exam  Constitutional: He is oriented to person, place, and time. He appears well-developed and well-nourished. No distress.       Afebrile No distress Clinically appears well  HENT:  Head: Normocephalic.  Right Ear: External ear normal.  Left Ear: External ear normal.  Eyes: Conjunctivae and EOM are normal.  Neck: Normal range of motion.  Cardiovascular: Normal rate and normal heart sounds.   Pulmonary/Chest: Effort normal. He has rales.       Bibasilar rales noted  Abdominal: Bowel sounds are normal.  Musculoskeletal: Normal range of motion. He exhibits no edema and no tenderness.  Neurological: He is alert and oriented to person, place, and time.  Psychiatric: He has a normal mood and affect. His behavior is normal.          Assessment & Plan:   Resolving URI improved. Will followup with his diabetes in 3 months if bilateral pulmonary rales persist we'll consider a followup chest x-ray Hypertension stable

## 2011-08-31 NOTE — Patient Instructions (Signed)
Please check your hemoglobin A1c every 3 months   

## 2012-03-27 ENCOUNTER — Encounter (HOSPITAL_COMMUNITY): Payer: Self-pay | Admitting: Pharmacy Technician

## 2012-03-31 ENCOUNTER — Other Ambulatory Visit (HOSPITAL_COMMUNITY): Payer: Self-pay | Admitting: Orthopaedic Surgery

## 2012-04-01 ENCOUNTER — Encounter (HOSPITAL_COMMUNITY)
Admission: RE | Admit: 2012-04-01 | Discharge: 2012-04-01 | Disposition: A | Discharge status: Home / Self Care | Payer: Medicare Other | Source: Ambulatory Visit | Attending: Orthopaedic Surgery | Admitting: Orthopaedic Surgery

## 2012-04-01 ENCOUNTER — Encounter (HOSPITAL_COMMUNITY): Payer: Self-pay

## 2012-04-01 HISTORY — DX: Pneumonia, unspecified organism: J18.9

## 2012-04-01 HISTORY — DX: Unspecified osteoarthritis, unspecified site: M19.90

## 2012-04-01 LAB — PROTIME-INR
INR: 1.03 (ref 0.00–1.49)
Prothrombin Time: 13.4 seconds (ref 11.6–15.2)

## 2012-04-01 LAB — CBC
HCT: 39.3 % (ref 39.0–52.0)
MCH: 31.7 pg (ref 26.0–34.0)
MCHC: 33.8 g/dL (ref 30.0–36.0)
MCV: 93.8 fL (ref 78.0–100.0)
Platelets: 296 10*3/uL (ref 150–400)
RDW: 14.5 % (ref 11.5–15.5)

## 2012-04-01 LAB — COMPREHENSIVE METABOLIC PANEL
Albumin: 4.2 g/dL (ref 3.5–5.2)
BUN: 12 mg/dL (ref 6–23)
Calcium: 9.8 mg/dL (ref 8.4–10.5)
Creatinine, Ser: 0.92 mg/dL (ref 0.50–1.35)
Total Bilirubin: 0.6 mg/dL (ref 0.3–1.2)
Total Protein: 7.1 g/dL (ref 6.0–8.3)

## 2012-04-01 LAB — SURGICAL PCR SCREEN: MRSA, PCR: NEGATIVE

## 2012-04-01 MED ORDER — VANCOMYCIN HCL IN DEXTROSE 1-5 GM/200ML-% IV SOLN: 1000.0000 mg | INTRAVENOUS | Status: DC

## 2012-04-01 NOTE — Pre-Procedure Instructions (Signed)
[facility]  04/01/2012   Your procedure is scheduled on:  Monday, October 14th.  Report to [facility] at 9:00AM.  Call this number if you have problems the morning of surgery: [phone]   Remember:   Do not eat food or drink any liquid:After Midnight.      Take these medicines the morning of surgery with A SIP OF WATER: None  Stop taking any Aspirin, NSAIDS (Ibuprofen, Naproxen).   Do not wear jewelry, make-up or nail polish.  Do not wear lotions, powders, or perfumes. You may wear deodorant.  Do not shave 48 hours prior to surgery. Men may shave face and neck.  Do not bring valuables to the hospital.  Contacts, dentures or bridgework may not be worn into surgery.  Leave suitcase in the car. After surgery it may be brought to your room.  For patients admitted to the hospital, checkout time is 11:00 AM the day of discharge.   Patients discharged the day of surgery will not be allowed to drive home.  Name and phone number of your driver:NA   Special Instructions: Shower using CHG 2 nights before surgery and the night before surgery.  If you shower the day of surgery use CHG.  Use special wash - you have one bottle of CHG for all showers.  You should use approximately 1/3 of the bottle for each shower.   Please read over the following fact sheets that you were given: Pain Booklet, Coughing and Deep Breathing, Blood Transfusion Information and Surgical Site Infection Prevention

## 2012-04-01 NOTE — Progress Notes (Signed)
Jorge Smith called office prior to pt arrival for orders to be signed ... They were not signed at PAT visit .. U/a was not done... Also patient refused to wear the blood band for one week and therefore the type and screen was not done.

## 2012-04-03 NOTE — H&P (Signed)
TOTAL HIP ADMISSION H&P  Patient is admitted for left total hip arthroplasty.  Subjective:  Chief Complaint: left hip pain  HPI: Jorge Smith, 78 y.o. male, has a history of pain and functional disability in the left hip(s) due to arthritis and patient has failed non-surgical conservative treatments for greater than 12 weeks to include NSAID's and/or analgesics, corticosteriod injections and activity modification.  Onset of symptoms was rapidly progressing starting 2 years ago with rapidlly worsening course since that time.The patient noted no past surgery on the left hip(s).  Patient currently rates pain in the left hip at 7 out of 10 with activity. Patient has worsening of pain with activity and weight bearing, trendelenberg gait and pain that interfers with activities of daily living. Patient has evidence of joint space narrowing by imaging studies. This condition presents safety issues increasing the risk of falls. This patient has had rapidy progressing degenerative changes on radiographs and minimal improvement with conservative treatment..  There is no current active infection.  Patient Active Problem List   Diagnosis Date Noted  . FOOT PAIN, LEFT 07/14/2010  . SVT 07/05/2010  . OTHER FLUID OVERLOAD 01/23/2010  . COLONIC POLYPS, HX OF 08/22/2009  . CARPAL TUNNEL SYNDROME, BILATERAL 02/22/2009  . PARESTHESIA 02/15/2009  . POLYMYALGIA RHEUMATICA 10/04/2008  . ATHEROSCLEROSIS W /INT CLAUDICATION 08/29/2008  . BENIGN PROSTATIC HYPERTROPHY 04/12/2008  . POLYARTHRALGIA 10/23/2007  . MUSCLE PAIN 09/22/2007  . CHEST PAIN 09/22/2007  . DIABETES MELLITUS, TYPE II 08/20/2007  . PERIPHERAL VASCULAR DISEASE 08/20/2007  . HYPERLIPIDEMIA 07/31/2007  . ESSENTIAL HYPERTENSION 07/31/2007  . GERD 07/31/2007  . SKIN LESIONS, MULTIPLE 07/31/2007  . LOW BACK PAIN 07/31/2007  . PALPITATIONS 07/31/2007   Past Medical History  Diagnosis Date  . SVT (supraventricular tachycardia)     AVNRT s/p  slow pathway ablation by JA 5/12  . Hypertension   . Hyperlipidemia   . Chest pain     HX OF  . PVD (peripheral vascular disease)     claudication  . Hx of colonic polyps   . Polymyalgia rheumatica   . BPH (benign prostatic hypertrophy)   . Low back pain   . Pneumonia 2001  . Diabetes mellitus     no meds and pt denies having diabetes.   . Arthritis     Past Surgical History  Procedure Date  . Carpal tunnel release 2010  . Hernia repair     x 2  . Cholecystectomy   . Total hip arthroplasty 2011    complicated by postop ilieus  . Atrial ablation surgery 11/23/10    slow pathway modification for AVNRT by JA    Prescriptions prior to admission  Medication Sig Dispense Refill  . aspirin 81 MG tablet Take 81 mg by mouth every other day.       . B Complex-C (SUPER B COMPLEX) TABS Take 1 tablet by mouth daily.        . benazepril (LOTENSIN) 40 MG tablet Take 1 tablet (40 mg total) by mouth daily.  90 tablet  6  . diphenhydramine-acetaminophen (TYLENOL PM) 25-500 MG TABS Take 2 tablets by mouth at bedtime as needed.      . docusate sodium (COLACE) 100 MG capsule Take 100 mg by mouth every morning.      . Glucosamine-Chondroit-Vit C-Mn (GLUCOSAMINE CHONDROITIN COMPLX) TABS Take 1 tablet by mouth daily.        Marland Kitchen ibuprofen (ADVIL,MOTRIN) 200 MG tablet Take 200 mg by mouth every 6 (six)  hours as needed. For pain      . Ibuprofen-Diphenhydramine HCl (ADVIL PM) 200-25 MG CAPS Take 2 tablets by mouth at bedtime.       . Multiple Vitamin (MULTIVITAMIN) tablet Take 1 tablet by mouth daily.        . naproxen sodium (ANAPROX) 220 MG tablet Take 440 mg by mouth every morning.      . Potassium Gluconate 550 MG TABS Take 1 tablet by mouth daily.      . psyllium (FIBER LAXATIVE) 0.52 G capsule Take 0.52 g by mouth daily.       . simvastatin (ZOCOR) 40 MG tablet Take 1 tablet (40 mg total) by mouth at bedtime.  90 tablet  6   Allergies  Allergen Reactions  . Penicillins     "very high fever"      History  Substance Use Topics  . Smoking status: Former Smoker    Types: Cigarettes    Quit date: 06/25/2008  . Smokeless tobacco: Not on file  . Alcohol Use: 3.0 oz/week    6 drink(s) per week     one 3-4 times per week    Family History  Problem Relation Age of Onset  . Other Mother 46    died in a fire  . Heart attack Father 32  . Heart attack Other 33    paternal grandfather  . Other Sister 22    had nephrectomy alive and well     Review of Systems  Constitutional: Negative.   HENT: Negative.   Eyes: Negative.   Respiratory: Negative.   Cardiovascular: Negative.   Gastrointestinal: Negative.   Genitourinary: Negative.   Musculoskeletal: Positive for joint pain.  Skin: Negative.   Neurological: Negative.   Endo/Heme/Allergies: Negative.   Psychiatric/Behavioral: Negative.     Objective:  Physical Exam  Constitutional: He is oriented to person, place, and time. He appears well-developed and well-nourished.  HENT:  Head: Normocephalic and atraumatic.  Eyes: EOM are normal.  Neck: Normal range of motion. Neck supple.  Cardiovascular: Normal rate.   GI: Soft.  Musculoskeletal:       Trendelenburg gait with ambulation.  Leg lengths equal.  Distal pulses intact.  Limited and painful ROM left hip  Neurological: He is alert and oriented to person, place, and time.  Skin: Skin is warm and dry.  Psychiatric: He has a normal mood and affect.    Vital signs in last 24 hours: Temp:  [97.7 F (36.5 C)] 97.7 F (36.5 C) (10/14 0852) Pulse Rate:  [75] 75  (10/14 0852) Resp:  [18] 18  (10/14 0852) BP: (180)/(88) 180/88 mmHg (10/14 0852) SpO2:  [98 %] 98 % (10/14 0852)  Labs:   Estimated Body mass index is 27.67 kg/(m^2) as calculated from the following:   Height as of 08/01/11: 5\' 8" (1.727 m).   Weight as of 08/31/11: 182 lb(82.555 kg).   Imaging Review Plain radiographs demonstrate severe degenerative joint disease of the left hip(s). The bone quality appears  to be adequate for age and reported activity level.  Assessment/Plan:  End stage arthritis, left hip(s)  The patient history, physical examination, clinical judgement of the provider and imaging studies are consistent with end stage degenerative joint disease of the left hip(s) and total hip arthroplasty is deemed medically necessary. The treatment options including medical management, injection therapy, arthroscopy and arthroplasty were discussed at length. The risks and benefits of total hip arthroplasty were presented and reviewed. The risks due to aseptic loosening,  infection, stiffness, dislocation/subluxation,  thromboembolic complications and other imponderables were discussed.  The patient acknowledged the explanation, agreed to proceed with the plan and consent was signed. Patient is being admitted for inpatient treatment for surgery, pain control, PT, OT, prophylactic antibiotics, VTE prophylaxis, progressive ambulation and ADL's and discharge planning.The patient is planning to be discharged home with home health services

## 2012-04-07 ENCOUNTER — Encounter (HOSPITAL_COMMUNITY): Payer: Self-pay | Admitting: Anesthesiology

## 2012-04-07 ENCOUNTER — Inpatient Hospital Stay (HOSPITAL_COMMUNITY): Payer: Medicare Other

## 2012-04-07 ENCOUNTER — Inpatient Hospital Stay (HOSPITAL_COMMUNITY)
Admission: RE | Admit: 2012-04-07 | Discharge: 2012-04-09 | DRG: 470 | Disposition: A | Discharge status: Home Health | Payer: Medicare Other | Source: Ambulatory Visit | Attending: Orthopaedic Surgery | Admitting: Orthopaedic Surgery

## 2012-04-07 ENCOUNTER — Encounter (HOSPITAL_COMMUNITY)
Admission: RE | Disposition: A | Discharge status: Home Health | Payer: Self-pay | Source: Ambulatory Visit | Attending: Orthopaedic Surgery

## 2012-04-07 ENCOUNTER — Inpatient Hospital Stay (HOSPITAL_COMMUNITY): Payer: Medicare Other | Admitting: Anesthesiology

## 2012-04-07 ENCOUNTER — Encounter (HOSPITAL_COMMUNITY): Payer: Self-pay | Admitting: *Deleted

## 2012-04-07 DIAGNOSIS — N400 Enlarged prostate without lower urinary tract symptoms: Secondary | ICD-10-CM | POA: Diagnosis present

## 2012-04-07 DIAGNOSIS — M1612 Unilateral primary osteoarthritis, left hip: Secondary | ICD-10-CM | POA: Diagnosis present

## 2012-04-07 DIAGNOSIS — M169 Osteoarthritis of hip, unspecified: Principal | ICD-10-CM | POA: Diagnosis present

## 2012-04-07 DIAGNOSIS — M353 Polymyalgia rheumatica: Secondary | ICD-10-CM | POA: Diagnosis present

## 2012-04-07 DIAGNOSIS — E119 Type 2 diabetes mellitus without complications: Secondary | ICD-10-CM | POA: Diagnosis present

## 2012-04-07 DIAGNOSIS — Z87891 Personal history of nicotine dependence: Secondary | ICD-10-CM

## 2012-04-07 DIAGNOSIS — Z7982 Long term (current) use of aspirin: Secondary | ICD-10-CM

## 2012-04-07 DIAGNOSIS — Z96649 Presence of unspecified artificial hip joint: Secondary | ICD-10-CM

## 2012-04-07 DIAGNOSIS — E785 Hyperlipidemia, unspecified: Secondary | ICD-10-CM | POA: Diagnosis present

## 2012-04-07 DIAGNOSIS — M1610 Unilateral primary osteoarthritis, unspecified hip: Principal | ICD-10-CM | POA: Diagnosis present

## 2012-04-07 DIAGNOSIS — I10 Essential (primary) hypertension: Secondary | ICD-10-CM | POA: Diagnosis present

## 2012-04-07 DIAGNOSIS — K5900 Constipation, unspecified: Secondary | ICD-10-CM | POA: Diagnosis present

## 2012-04-07 HISTORY — PX: TOTAL HIP ARTHROPLASTY: SHX124

## 2012-04-07 LAB — GLUCOSE, CAPILLARY

## 2012-04-07 LAB — URINALYSIS, ROUTINE W REFLEX MICROSCOPIC
Bilirubin Urine: NEGATIVE
Leukocytes, UA: NEGATIVE
Nitrite: NEGATIVE
Specific Gravity, Urine: 1.022 (ref 1.005–1.030)
Urobilinogen, UA: 0.2 mg/dL (ref 0.0–1.0)

## 2012-04-07 LAB — TYPE AND SCREEN

## 2012-04-07 LAB — ABO/RH: ABO/RH(D): A POS

## 2012-04-07 SURGERY — ARTHROPLASTY, HIP, TOTAL, ANTERIOR APPROACH
Anesthesia: General | Site: Hip | Laterality: Left | Wound class: Clean

## 2012-04-07 MED ORDER — ACETAMINOPHEN 10 MG/ML IV SOLN
1000.0000 mg | Freq: Once | INTRAVENOUS | Status: AC
  Administered 2012-04-07: 1000 mg via INTRAVENOUS
  Filled 2012-04-07: qty 100

## 2012-04-07 MED ORDER — DOCUSATE SODIUM 100 MG PO CAPS
100.0000 mg | ORAL_CAPSULE | Freq: Two times a day (BID) | ORAL | Status: DC
  Administered 2012-04-07 – 2012-04-09 (×4): 100 mg via ORAL
  Filled 2012-04-07 (×4): qty 1

## 2012-04-07 MED ORDER — CEFAZOLIN SODIUM-DEXTROSE 2-3 GM-% IV SOLR
2.0000 g | Freq: Once | INTRAVENOUS | Status: AC
  Administered 2012-04-07: 2 g via INTRAVENOUS
  Filled 2012-04-07: qty 50

## 2012-04-07 MED ORDER — HYDROMORPHONE HCL PF 1 MG/ML IJ SOLN
INTRAMUSCULAR | Status: AC
  Filled 2012-04-07: qty 1

## 2012-04-07 MED ORDER — PHENOL 1.4 % MT LIQD: 1.0000 | OROMUCOSAL | Status: DC | PRN

## 2012-04-07 MED ORDER — ONDANSETRON HCL 4 MG/2ML IJ SOLN
INTRAMUSCULAR | Status: DC | PRN
  Administered 2012-04-07: 4 mg via INTRAVENOUS

## 2012-04-07 MED ORDER — LACTATED RINGERS IV SOLN
INTRAVENOUS | Status: DC | PRN
  Administered 2012-04-07 (×2): via INTRAVENOUS

## 2012-04-07 MED ORDER — METOCLOPRAMIDE HCL 5 MG/ML IJ SOLN
5.0000 mg | Freq: Three times a day (TID) | INTRAMUSCULAR | Status: DC | PRN
  Administered 2012-04-08: 10 mg via INTRAVENOUS
  Filled 2012-04-07 (×2): qty 2

## 2012-04-07 MED ORDER — LIDOCAINE HCL (CARDIAC) 20 MG/ML IV SOLN
INTRAVENOUS | Status: DC | PRN
  Administered 2012-04-07: 100 mg via INTRAVENOUS

## 2012-04-07 MED ORDER — B COMPLEX-C PO TABS
1.0000 | ORAL_TABLET | Freq: Every day | ORAL | Status: DC
  Administered 2012-04-07 – 2012-04-09 (×3): 1 via ORAL
  Filled 2012-04-07 (×3): qty 1

## 2012-04-07 MED ORDER — BISACODYL 10 MG RE SUPP: 10.0000 mg | Freq: Every day | RECTAL | Status: DC | PRN

## 2012-04-07 MED ORDER — POTASSIUM GLUCONATE 550 MG PO TABS
1.0000 | ORAL_TABLET | Freq: Every day | ORAL | Status: DC
  Filled 2012-04-07 (×3): qty 1

## 2012-04-07 MED ORDER — FENTANYL CITRATE 0.05 MG/ML IJ SOLN
INTRAMUSCULAR | Status: DC | PRN
  Administered 2012-04-07 (×7): 50 ug via INTRAVENOUS

## 2012-04-07 MED ORDER — ZOLPIDEM TARTRATE 5 MG PO TABS
5.0000 mg | ORAL_TABLET | Freq: Every evening | ORAL | Status: DC | PRN
  Administered 2012-04-08: 5 mg via ORAL
  Filled 2012-04-07: qty 1

## 2012-04-07 MED ORDER — 0.9 % SODIUM CHLORIDE (POUR BTL) OPTIME
TOPICAL | Status: DC | PRN
  Administered 2012-04-07: 1000 mL

## 2012-04-07 MED ORDER — ASPIRIN EC 325 MG PO TBEC
325.0000 mg | DELAYED_RELEASE_TABLET | Freq: Every day | ORAL | Status: DC
  Administered 2012-04-08 – 2012-04-09 (×2): 325 mg via ORAL
  Filled 2012-04-07 (×3): qty 1

## 2012-04-07 MED ORDER — FLEET ENEMA 7-19 GM/118ML RE ENEM: 1.0000 | ENEMA | Freq: Once | RECTAL | Status: AC | PRN

## 2012-04-07 MED ORDER — SENNOSIDES-DOCUSATE SODIUM 8.6-50 MG PO TABS: 1.0000 | ORAL_TABLET | Freq: Every evening | ORAL | Status: DC | PRN

## 2012-04-07 MED ORDER — ACETAMINOPHEN 650 MG RE SUPP: 650.0000 mg | Freq: Four times a day (QID) | RECTAL | Status: DC | PRN

## 2012-04-07 MED ORDER — ACETAMINOPHEN 10 MG/ML IV SOLN
INTRAVENOUS | Status: AC
  Filled 2012-04-07: qty 100

## 2012-04-07 MED ORDER — OXYCODONE HCL 5 MG PO TABS
5.0000 mg | ORAL_TABLET | ORAL | Status: DC | PRN
  Administered 2012-04-07 – 2012-04-09 (×4): 10 mg via ORAL
  Filled 2012-04-07 (×4): qty 2

## 2012-04-07 MED ORDER — NEOSTIGMINE METHYLSULFATE 1 MG/ML IJ SOLN
INTRAMUSCULAR | Status: DC | PRN
  Administered 2012-04-07: 3 mg via INTRAVENOUS

## 2012-04-07 MED ORDER — METOCLOPRAMIDE HCL 5 MG PO TABS
5.0000 mg | ORAL_TABLET | Freq: Three times a day (TID) | ORAL | Status: DC | PRN
  Filled 2012-04-07: qty 2

## 2012-04-07 MED ORDER — FENTANYL CITRATE 0.05 MG/ML IJ SOLN: INTRAMUSCULAR | Status: DC | PRN

## 2012-04-07 MED ORDER — MENTHOL 3 MG MT LOZG: 1.0000 | LOZENGE | OROMUCOSAL | Status: DC | PRN

## 2012-04-07 MED ORDER — ARTIFICIAL TEARS OP OINT
TOPICAL_OINTMENT | OPHTHALMIC | Status: DC | PRN
  Administered 2012-04-07: 1 via OPHTHALMIC

## 2012-04-07 MED ORDER — SIMVASTATIN 40 MG PO TABS
40.0000 mg | ORAL_TABLET | Freq: Every day | ORAL | Status: DC
  Administered 2012-04-07 – 2012-04-08 (×2): 40 mg via ORAL
  Filled 2012-04-07 (×3): qty 1

## 2012-04-07 MED ORDER — BENAZEPRIL HCL 40 MG PO TABS
40.0000 mg | ORAL_TABLET | Freq: Every day | ORAL | Status: DC
  Administered 2012-04-07 – 2012-04-09 (×3): 40 mg via ORAL
  Filled 2012-04-07 (×3): qty 1

## 2012-04-07 MED ORDER — PSYLLIUM 95 % PO PACK
1.0000 | PACK | Freq: Every day | ORAL | Status: DC
  Administered 2012-04-07 – 2012-04-08 (×2): 1 via ORAL
  Filled 2012-04-07 (×3): qty 1

## 2012-04-07 MED ORDER — ONDANSETRON HCL 4 MG PO TABS
4.0000 mg | ORAL_TABLET | Freq: Four times a day (QID) | ORAL | Status: DC | PRN
  Administered 2012-04-08: 4 mg via ORAL
  Filled 2012-04-07: qty 1

## 2012-04-07 MED ORDER — EPHEDRINE SULFATE 50 MG/ML IJ SOLN
INTRAMUSCULAR | Status: DC | PRN
  Administered 2012-04-07 (×2): 5 mg via INTRAVENOUS

## 2012-04-07 MED ORDER — PROPOFOL 10 MG/ML IV BOLUS
INTRAVENOUS | Status: DC | PRN
  Administered 2012-04-07: 200 mg via INTRAVENOUS

## 2012-04-07 MED ORDER — ROCURONIUM BROMIDE 100 MG/10ML IV SOLN
INTRAVENOUS | Status: DC | PRN
  Administered 2012-04-07: 50 mg via INTRAVENOUS

## 2012-04-07 MED ORDER — LACTATED RINGERS IV SOLN
INTRAVENOUS | Status: DC
  Administered 2012-04-07: 10:00:00 via INTRAVENOUS

## 2012-04-07 MED ORDER — ONDANSETRON HCL 4 MG/2ML IJ SOLN
4.0000 mg | Freq: Four times a day (QID) | INTRAMUSCULAR | Status: DC | PRN
  Administered 2012-04-07 (×2): 4 mg via INTRAVENOUS
  Filled 2012-04-07 (×2): qty 2

## 2012-04-07 MED ORDER — VECURONIUM BROMIDE 10 MG IV SOLR
INTRAVENOUS | Status: DC | PRN
  Administered 2012-04-07: 2 mg via INTRAVENOUS

## 2012-04-07 MED ORDER — GLYCOPYRROLATE 0.2 MG/ML IJ SOLN
INTRAMUSCULAR | Status: DC | PRN
  Administered 2012-04-07: 0.4 mg via INTRAVENOUS

## 2012-04-07 MED ORDER — KCL IN DEXTROSE-NACL 20-5-0.45 MEQ/L-%-% IV SOLN
INTRAVENOUS | Status: DC
  Administered 2012-04-08 (×2): via INTRAVENOUS
  Filled 2012-04-07 (×6): qty 1000

## 2012-04-07 MED ORDER — POTASSIUM GLUCONATE 550 MG PO TABS: 1.0000 | ORAL_TABLET | Freq: Every day | ORAL | Status: DC

## 2012-04-07 MED ORDER — HYDROMORPHONE HCL PF 1 MG/ML IJ SOLN
0.5000 mg | Freq: Once | INTRAMUSCULAR | Status: AC
  Administered 2012-04-07: 0.5 mg via INTRAVENOUS

## 2012-04-07 MED ORDER — KETOROLAC TROMETHAMINE 15 MG/ML IJ SOLN
15.0000 mg | Freq: Four times a day (QID) | INTRAMUSCULAR | Status: AC
  Administered 2012-04-07 – 2012-04-08 (×4): 15 mg via INTRAVENOUS
  Filled 2012-04-07 (×4): qty 1

## 2012-04-07 MED ORDER — CEFAZOLIN SODIUM 1-5 GM-% IV SOLN
INTRAVENOUS | Status: AC
  Filled 2012-04-07: qty 100

## 2012-04-07 MED ORDER — DIPHENHYDRAMINE HCL 12.5 MG/5ML PO ELIX: 12.5000 mg | ORAL_SOLUTION | ORAL | Status: DC | PRN

## 2012-04-07 MED ORDER — CLINDAMYCIN PHOSPHATE 900 MG/50ML IV SOLN
900.0000 mg | INTRAVENOUS | Status: DC
  Filled 2012-04-07: qty 50

## 2012-04-07 MED ORDER — METHOCARBAMOL 100 MG/ML IJ SOLN
500.0000 mg | Freq: Four times a day (QID) | INTRAMUSCULAR | Status: DC | PRN
  Administered 2012-04-07: 500 mg via INTRAVENOUS
  Filled 2012-04-07 (×2): qty 5

## 2012-04-07 MED ORDER — ACETAMINOPHEN 325 MG PO TABS: 650.0000 mg | ORAL_TABLET | Freq: Four times a day (QID) | ORAL | Status: DC | PRN

## 2012-04-07 MED ORDER — HYDROMORPHONE HCL PF 1 MG/ML IJ SOLN
1.0000 mg | INTRAMUSCULAR | Status: DC | PRN
  Administered 2012-04-07 – 2012-04-08 (×3): 1 mg via INTRAVENOUS
  Filled 2012-04-07 (×2): qty 1

## 2012-04-07 MED ORDER — METHOCARBAMOL 500 MG PO TABS
500.0000 mg | ORAL_TABLET | Freq: Four times a day (QID) | ORAL | Status: DC | PRN
  Administered 2012-04-07 – 2012-04-08 (×2): 500 mg via ORAL
  Filled 2012-04-07 (×3): qty 1

## 2012-04-07 MED ORDER — ADULT MULTIVITAMIN W/MINERALS CH
1.0000 | ORAL_TABLET | Freq: Every day | ORAL | Status: DC
  Administered 2012-04-07 – 2012-04-09 (×3): 1 via ORAL
  Filled 2012-04-07 (×3): qty 1

## 2012-04-07 SURGICAL SUPPLY — 48 items
BLADE SAW SGTL 18X1.27X75 (BLADE) ×2 IMPLANT
BLADE SURG ROTATE 9660 (MISCELLANEOUS) IMPLANT
CELLS DAT CNTRL 66122 CELL SVR (MISCELLANEOUS) ×1 IMPLANT
CLOTH BEACON ORANGE TIMEOUT ST (SAFETY) ×2 IMPLANT
COVER SURGICAL LIGHT HANDLE (MISCELLANEOUS) ×2 IMPLANT
DRAPE C-ARM 42X72 X-RAY (DRAPES) ×2 IMPLANT
DRAPE STERI IOBAN 125X83 (DRAPES) ×2 IMPLANT
DRAPE U-SHAPE 47X51 STRL (DRAPES) ×6 IMPLANT
DRSG MEPILEX BORDER 4X8 (GAUZE/BANDAGES/DRESSINGS) ×2 IMPLANT
DURAPREP 26ML APPLICATOR (WOUND CARE) ×2 IMPLANT
ELECT BLADE 4.0 EZ CLEAN MEGAD (MISCELLANEOUS)
ELECT BLADE TIP CTD 4 INCH (ELECTRODE) ×2 IMPLANT
ELECT CAUTERY BLADE 6.4 (BLADE) ×2 IMPLANT
ELECT REM PT RETURN 9FT ADLT (ELECTROSURGICAL) ×2
ELECTRODE BLDE 4.0 EZ CLN MEGD (MISCELLANEOUS) IMPLANT
ELECTRODE REM PT RTRN 9FT ADLT (ELECTROSURGICAL) ×1 IMPLANT
FACESHIELD LNG OPTICON STERILE (SAFETY) ×4 IMPLANT
GAUZE XEROFORM 1X8 LF (GAUZE/BANDAGES/DRESSINGS) ×2 IMPLANT
GLOVE BIOGEL PI IND STRL 7.5 (GLOVE) ×1 IMPLANT
GLOVE BIOGEL PI IND STRL 8 (GLOVE) ×1 IMPLANT
GLOVE BIOGEL PI INDICATOR 7.5 (GLOVE) ×1
GLOVE BIOGEL PI INDICATOR 8 (GLOVE) ×1
GLOVE ECLIPSE 7.0 STRL STRAW (GLOVE) ×2 IMPLANT
GLOVE ORTHO TXT STRL SZ7.5 (GLOVE) ×2 IMPLANT
GOWN PREVENTION PLUS LG XLONG (DISPOSABLE) IMPLANT
GOWN STRL NON-REIN LRG LVL3 (GOWN DISPOSABLE) ×4 IMPLANT
GOWN STRL REIN XL XLG (GOWN DISPOSABLE) ×2 IMPLANT
KIT BASIN OR (CUSTOM PROCEDURE TRAY) ×2 IMPLANT
KIT ROOM TURNOVER OR (KITS) ×2 IMPLANT
MANIFOLD NEPTUNE II (INSTRUMENTS) ×2 IMPLANT
NS IRRIG 1000ML POUR BTL (IV SOLUTION) ×2 IMPLANT
PACK TOTAL JOINT (CUSTOM PROCEDURE TRAY) ×2 IMPLANT
PAD ARMBOARD 7.5X6 YLW CONV (MISCELLANEOUS) ×4 IMPLANT
RTRCTR WOUND ALEXIS 18CM MED (MISCELLANEOUS) ×2
SPONGE LAP 18X18 X RAY DECT (DISPOSABLE) ×2 IMPLANT
SPONGE LAP 4X18 X RAY DECT (DISPOSABLE) IMPLANT
STAPLER VISISTAT 35W (STAPLE) ×2 IMPLANT
SUT ETHIBOND NAB CT1 #1 30IN (SUTURE) ×4 IMPLANT
SUT VIC AB 0 CT1 27 (SUTURE) ×2
SUT VIC AB 0 CT1 27XBRD ANBCTR (SUTURE) ×2 IMPLANT
SUT VIC AB 1 CT1 27 (SUTURE) ×2
SUT VIC AB 1 CT1 27XBRD ANBCTR (SUTURE) ×2 IMPLANT
SUT VIC AB 2-0 CT1 27 (SUTURE) ×2
SUT VIC AB 2-0 CT1 TAPERPNT 27 (SUTURE) ×2 IMPLANT
TOWEL OR 17X24 6PK STRL BLUE (TOWEL DISPOSABLE) ×2 IMPLANT
TOWEL OR 17X26 10 PK STRL BLUE (TOWEL DISPOSABLE) ×4 IMPLANT
TRAY FOLEY CATH 14FR (SET/KITS/TRAYS/PACK) IMPLANT
WATER STERILE IRR 1000ML POUR (IV SOLUTION) ×4 IMPLANT

## 2012-04-07 NOTE — Preoperative (Signed)
Beta Blockers   Reason not to administer Beta Blockers:Not Applicable 

## 2012-04-07 NOTE — Transfer of Care (Signed)
Immediate Anesthesia Transfer of Care Note  Patient: Jorge Smith  Procedure(s) Performed: Procedure(s) (LRB) with comments: TOTAL HIP ARTHROPLASTY ANTERIOR APPROACH (Left) - Left Total Hip Arthroplasty, Anterior Approach  Patient Location: PACU  Anesthesia Type: General  Level of Consciousness: awake, alert  and oriented  Airway & Oxygen Therapy: Patient Spontanous Breathing and Patient connected to nasal cannula oxygen  Post-op Assessment: Report given to PACU RN and Post -op Vital signs reviewed and stable  Post vital signs: Reviewed  Complications: No apparent anesthesia complications

## 2012-04-07 NOTE — Interval H&P Note (Signed)
History and Physical Interval Note:  04/07/2012 11:35 AM  [name]  has presented today for surgery, with the diagnosis of Left Hip Osteoarthritis  The various methods of treatment have been discussed with the patient and family. After consideration of risks, benefits and other options for treatment, the patient has consented to  Procedure(s) (LRB) with comments: TOTAL HIP ARTHROPLASTY ANTERIOR APPROACH (Left) - Left Total Hip Arthroplasty, Anterior Approach as a surgical intervention .  The patient's history has been reviewed, patient examined, no change in status, stable for surgery.  I have reviewed the patient's chart and labs.  Questions were answered to the patient's satisfaction.     Jorge Smith

## 2012-04-07 NOTE — Plan of Care (Signed)
Problem: Consults Goal: Diagnosis- Total Joint Replacement Primary Total Hip Left     

## 2012-04-07 NOTE — Anesthesia Procedure Notes (Signed)
Procedure Name: Intubation Date/Time: 04/07/2012 12:20 PM Performed by: Rossie Muskrat L Pre-anesthesia Checklist: Patient identified, Timeout performed, Emergency Drugs available, Suction available and Patient being monitored Patient Re-evaluated:Patient Re-evaluated prior to inductionOxygen Delivery Method: Circle system utilized Preoxygenation: Pre-oxygenation with 100% oxygen Intubation Type: IV induction Ventilation: Mask ventilation without difficulty Laryngoscope Size: Miller and 2 Grade View: Grade I Tube type: Oral Tube size: 7.5 mm Number of attempts: 1 Airway Equipment and Method: Stylet Placement Confirmation: ETT inserted through vocal cords under direct vision,  breath sounds checked- equal and bilateral and positive ETCO2 Secured at: 22 cm Tube secured with: Tape Dental Injury: Teeth and Oropharynx as per pre-operative assessment

## 2012-04-07 NOTE — Brief Op Note (Cosign Needed)
04/07/2012  2:17 PM  PATIENT:  [name]  78 y.o. male  PRE-OPERATIVE DIAGNOSIS:  Left Hip Osteoarthritis  POST-OPERATIVE DIAGNOSIS:  Left Hip Osteoarthritis  PROCEDURE:  Procedure(s) (LRB) with comments: TOTAL HIP ARTHROPLASTY ANTERIOR APPROACH (Left) - Left Total Hip Arthroplasty, Anterior Approach  SURGEON:  Surgeon(s) and Role:    * Eldred Manges, MD - Primary    * Kathryne Hitch, MD - Assisting  PHYSICIAN ASSISTANT: Maud Deed [facility]  ASSISTANTS: none   ANESTHESIA:   general  EBL:  Total I/O In: 1000 [I.V.:1000] Out: 575 [Urine:275; Blood:300]  BLOOD ADMINISTERED:none  DRAINS: none   LOCAL MEDICATIONS USED:  NONE  SPECIMEN:  No Specimen  DISPOSITION OF SPECIMEN:  N/A  COUNTS:  YES  TOURNIQUET:  * No tourniquets in log *  DICTATION: .Note written in EPIC  PLAN OF CARE: Admit to inpatient   PATIENT DISPOSITION:  PACU - hemodynamically stable.   Delay start of Pharmacological VTE agent (>24hrs) due to surgical blood loss or risk of bleeding: no

## 2012-04-07 NOTE — Anesthesia Postprocedure Evaluation (Signed)
  Anesthesia Post-op Note  Patient: Jorge Smith  Procedure(s) Performed: Procedure(s) (LRB) with comments: TOTAL HIP ARTHROPLASTY ANTERIOR APPROACH (Left) - Left Total Hip Arthroplasty, Anterior Approach  Patient Location: PACU  Anesthesia Type: General  Level of Consciousness: awake  Airway and Oxygen Therapy: Patient Spontanous Breathing  Post-op Pain: mild  Post-op Assessment: Post-op Vital signs reviewed  Post-op Vital Signs: Reviewed  Complications: No apparent anesthesia complications

## 2012-04-07 NOTE — Anesthesia Preprocedure Evaluation (Addendum)
Anesthesia Evaluation  Patient identified by MRN, date of birth, ID band Patient awake    Reviewed: Allergy & Precautions, H&P , NPO status , Patient's Chart, lab work & pertinent test results  History of Anesthesia Complications Negative for: history of anesthetic complications  Airway Mallampati: II TM Distance: >3 FB Neck ROM: Full    Dental  (+) Loose, Teeth Intact and Dental Advisory Given   Pulmonary pneumonia -, former smoker,  breath sounds clear to auscultation        Cardiovascular hypertension, Pt. on medications + dysrhythmias (Ablation for SVT 2012) Rhythm:Regular Rate:Normal     Neuro/Psych    GI/Hepatic Neg liver ROS, GERD-  ,  Endo/Other  diabetesBorderline DM2  Renal/GU negative Renal ROS     Musculoskeletal   Abdominal   Peds  Hematology   Anesthesia Other Findings   Reproductive/Obstetrics                          Anesthesia Physical Anesthesia Plan  ASA: III  Anesthesia Plan: General   Post-op Pain Management:    Induction: Intravenous  Airway Management Planned: Oral ETT  Additional Equipment:   Intra-op Plan:   Post-operative Plan: Extubation in OR  Informed Consent: I have reviewed the patients History and Physical, chart, labs and discussed the procedure including the risks, benefits and alternatives for the proposed anesthesia with the patient or authorized representative who has indicated his/her understanding and acceptance.   Dental advisory given  Plan Discussed with: CRNA, Anesthesiologist and Surgeon  Anesthesia Plan Comments:         Anesthesia Quick Evaluation

## 2012-04-08 ENCOUNTER — Encounter (HOSPITAL_COMMUNITY): Payer: Self-pay | Admitting: General Practice

## 2012-04-08 HISTORY — PX: TOTAL HIP ARTHROPLASTY: SHX124

## 2012-04-08 LAB — BASIC METABOLIC PANEL
BUN: 16 mg/dL (ref 6–23)
Chloride: 101 mEq/L (ref 96–112)
GFR calc Af Amer: >90 mL/min (ref >90)
Potassium: 4.4 mEq/L (ref 3.5–5.1)
Sodium: 133 mEq/L — ABNORMAL LOW (ref 135–145)

## 2012-04-08 LAB — CBC
HCT: 32 % — ABNORMAL LOW (ref 39.0–52.0)
Platelets: 256 10*3/uL (ref 150–400)
RDW: 14.7 % (ref 11.5–15.5)
WBC: 13.5 10*3/uL — ABNORMAL HIGH (ref 4.0–10.5)

## 2012-04-08 MED ORDER — PANTOPRAZOLE SODIUM 40 MG IV SOLR
40.0000 mg | INTRAVENOUS | Status: DC
  Filled 2012-04-08 (×2): qty 40

## 2012-04-08 MED ORDER — METOCLOPRAMIDE HCL 5 MG/ML IJ SOLN
10.0000 mg | Freq: Four times a day (QID) | INTRAMUSCULAR | Status: AC
  Administered 2012-04-08 – 2012-04-09 (×4): 10 mg via INTRAVENOUS
  Filled 2012-04-08 (×4): qty 2

## 2012-04-08 MED ORDER — SENNOSIDES-DOCUSATE SODIUM 8.6-50 MG PO TABS
1.0000 | ORAL_TABLET | Freq: Once | ORAL | Status: AC
  Administered 2012-04-08: 1 via ORAL
  Filled 2012-04-08: qty 1

## 2012-04-08 MED ORDER — PANTOPRAZOLE SODIUM 40 MG IV SOLR
40.0000 mg | Freq: Once | INTRAVENOUS | Status: AC
  Administered 2012-04-08: 40 mg via INTRAVENOUS
  Filled 2012-04-08: qty 40

## 2012-04-08 MED ORDER — ALUM & MAG HYDROXIDE-SIMETH 200-200-20 MG/5ML PO SUSP
30.0000 mL | ORAL | Status: DC | PRN
  Administered 2012-04-08 (×2): 30 mL via ORAL
  Filled 2012-04-08 (×2): qty 30

## 2012-04-08 MED ORDER — MORPHINE SULFATE 2 MG/ML IJ SOLN
2.0000 mg | INTRAMUSCULAR | Status: DC | PRN
  Administered 2012-04-09: 2 mg via INTRAVENOUS
  Filled 2012-04-08: qty 1

## 2012-04-08 MED ORDER — PROMETHAZINE HCL 25 MG/ML IJ SOLN
12.5000 mg | Freq: Once | INTRAMUSCULAR | Status: AC
  Administered 2012-04-08: 12.5 mg via INTRAVENOUS
  Filled 2012-04-08: qty 1

## 2012-04-08 MED ORDER — BISACODYL 10 MG RE SUPP: 10.0000 mg | Freq: Once | RECTAL | Status: DC

## 2012-04-08 MED ORDER — MAGNESIUM CITRATE PO SOLN
1.0000 | Freq: Once | ORAL | Status: AC
  Administered 2012-04-08: 1 via ORAL
  Filled 2012-04-08: qty 296

## 2012-04-08 NOTE — Progress Notes (Signed)
Agree with PT evaluation.  Secundino Ellithorpe, PT DPT [phone]  

## 2012-04-08 NOTE — Progress Notes (Signed)
Subjective: 1 Day Post-Op Procedure(s) (LRB): TOTAL HIP ARTHROPLASTY ANTERIOR APPROACH (Left) Patient reports pain as mild.   Pt with nausea and "heartburn" all morning .  No relief with zofran,reglan and maalox.  Phenergan and Protonix ordered. Pt with no flatus but reports belching.  Has chronic constipation and last BM Sunday.  No HO GERD. OOB today with PT.   Objective: Vital signs in last 24 hours: Temp:  [96.8 F (36 C)-98.3 F (36.8 C)] 98.3 F (36.8 C) (10/15 0548) Pulse Rate:  [53-90] 90  (10/15 1005) Resp:  [7-40] 18  (10/15 0759) BP: (116-172)/(45-77) 116/45 mmHg (10/15 1005) SpO2:  [91 %-100 %] 91 % (10/15 1005)  Intake/Output from previous day: 10/14 0701 - 10/15 0700 In: 2400 [I.V.:2400] Out: 775 [Urine:475; Blood:300] Intake/Output this shift:     [facility] 04/08/12 0610  HGB 11.2*    Basename 04/08/12 0610  WBC 13.5*  RBC 3.43*  HCT 32.0*  PLT 256    Basename 04/08/12 0610  NA 133*  K 4.4  CL 101  CO2 23  BUN 16  CREATININE 0.92  GLUCOSE 178*  CALCIUM 8.3*   No results found for this basename: LABPT:2,INR:2 in the last 72 hours  Neurovascular intact Sensation intact distally Intact pulses distally Dorsiflexion/Plantar flexion intact Incision: dressing C/D/I ABD distended and tympanic.  Min bowel sounds.  nontender  Assessment/Plan: 1 Day Post-Op Procedure(s) (LRB): TOTAL HIP ARTHROPLASTY ANTERIOR APPROACH (Left) Up with therapy Hold diet until bowel function returns.  Start Reglan IV q6 x24 hrs.  Dulcolax supp now if no results Mag. Citrate.  Cont IVF  Ziyonna Christner M 04/08/2012, 11:53 AM

## 2012-04-08 NOTE — Progress Notes (Signed)
CARE MANAGEMENT NOTE 04/08/2012  Patient:  Jorge Smith, Jorge Smith   Account Number:  [id number]  Date Initiated:  04/08/2012  Documentation initiated by:  Vance Peper  Subjective/Objective Assessment:   78 yr old male s/p left total hip arthroplasty.     Action/Plan:   Progression of care and discharge planning. CM spoke with patient regarding home health and DME needs at discharge.Choice offered. Patient has rolling walker, 3in1 ordered.   Anticipated DC Date:  04/10/2012   Anticipated DC Plan:  HOME W HOME HEALTH SERVICES      DC Planning Services  CM consult      PAC Choice  DURABLE MEDICAL EQUIPMENT  HOME HEALTH   Choice offered to / List presented to:  C-1 Patient   DME arranged  3-N-1      DME agency  [facility]     HH arranged  HH-2 PT      [employer] agency  [facility]   Status of service:  Completed, signed off Medicare Important Message given?   (If response is "NO", the following Medicare IM given date fields will be blank) Date Medicare IM given:   Date Additional Medicare IM given:    Discharge Disposition:  HOME W HOME HEALTH SERVICES  Per UR Regulation:    If discussed at Long Length of Stay Meetings, dates discussed:    Comments:

## 2012-04-08 NOTE — Op Note (Signed)
NAMEJEQUAN, SHAHIN NO.:  [id number]  MEDICAL RECORD NO.:  [id number]  LOCATION:  5N07C                        FACILITY:  [facility]  PHYSICIAN:  Livana Yerian C. Ophelia Charter, M.D.    DATE OF BIRTH:  1933/03/10  DATE OF PROCEDURE:  04/07/2012 DATE OF DISCHARGE:                              OPERATIVE REPORT   POSTOPERATIVE DIAGNOSIS:  Left hip osteoarthritis.  POSTOP DIAGNOSIS:  Left hip osteoarthritis.  PROCEDURE:  Left total his arthroplasty.  SURGEON:  Naftoli Penny C. Ophelia Charter, MD  FIRST ASSISTANT:  Vanita Panda. Magnus Ivan, MD  SECOND ASSISTANT:  Wende Neighbors, PA-C  ANESTHESIA:  GOT.  EBL:  150 mL.  COMPONENTS USED:  Corail #13 stem, 36-mm ball, +1.5 mm neck, standard polyethylene without wall buildup and 52-mm no-hole series 100 Pinnacle press-fit cup.  Anterior approach was used.  OPERATIVE PROCEDURE:  The patient was placed on the Hana table after being put to sleep, intubated, preoperative Ancef given.  Traction was applied to the legs.  Boots were applied on the stretcher.  C-arm was brought in to make sure there was good visualization, both trochanters could be seen, good visualization of the hip and the C-arm was backed up after the floor was marked for proper position and prepping and draping was performed with DuraPrep, split U15 sheets and drapes.  Initially, nonsteroid before prepping and then after prepping, sterile clear wound 10-15 below, one then sticky blue U-drape above.  Large shower curtain, Betadine, Steri-Drape was then applied.  Time-out procedure was completed.  Images were displayed.  Skin marker was used and tensor fascia was split after marking and outlining the ASIS.  Fascia over the tensor was split and then it was immediately elevated dividing the fascia, pushing the muscle down and progressing medially.  Once this was performed, fingertip could be placed down laterally onto the neck. Vessels from the lateral circumflex were coagulated with the  Bovie electrocautery and another retractor was placed immediately down over the medial aspect of the neck.  The capsule was opened.  The minimus was made sure that was not released.  Neck was cut.  Checking under fluoroscopy for neck cut and then the lateral aspect was completed with an osteotome.  Head was removed with corkscrew.  Sequential reaming of the acetabulum up to a 51, 51 was little bit dull.  We backed up to a 50 and then re-reamed to 51 for 52 cup.  Appropriate abduction and cup flexion was checked under fluoroscopy.  The cup was impacted down tightly, it was extremely secured.  The no-wall poly was inserted after the centralizing hole was inserted after irrigation.  The cup was down, had been reamed down to the base of the fovea.  Next, the lateral hook was used to pull up the femur slightly once the leg had been taken down and an external rotation, and then slight adduction.  Rongeur was used followed by the angled cookie cutter.  Sequential broaching was performed, and impaction broaching.  A #12 initially looked tight; however, once trials were inserted and backed out, it was not as tied it had been.  There was slight rotational instability and it was elected to proceed with a  13, which again gave excellent tight fit.  With the 12 in and broach cut perfect with the top of the broach so that the collar would sit directly on the calcar, +1.5 had complete restoration of leg length by fluoroscopy, trace shock, no impingement and good stability. Next, the permanent stem was inserted, the +1.5, 36 ball.  Hip was reduced.  Checked on the fluoroscopy identical findings of good length, good stability.  Capsule was repaired with #1 Ethibond.  Capsule had been opened in line with superior aspect of the neck and then falling down along the intertrochanteric line on entry.  After closure, a suture lock was used for running.  The tensor fascia was closed with 2-0 on the subcutaneous  tissue, subcuticular skin closure.  Dermabond, postop dressing, and transferred to the recovery room in stable condition. Instrument count and needle count was correct.  Operative field was dry.     Tuyen Uncapher C. Ophelia Charter, M.D.     MCY/MEDQ  D:  04/07/2012  T:  04/08/2012  Job:  [id number]

## 2012-04-08 NOTE — Evaluation (Signed)
Occupational Therapy Evaluation Patient Details Name: Jorge Smith MRN: [id number] DOB: Nov 03, 1932 Today's Date: 04/08/2012 Time: 4098-1191 OT Time Calculation (min): 39 min  OT Assessment / Plan / Recommendation Clinical Impression  Pleasant 78yr old man admitted for elective left hip replacement.  Currently presents with slight increased dependence with basic selfcare tasks compared to previous modified independent level.  Feel he will benefit from acute OT to help increase overall independence to a modified independent level for return home.  Pt lives in 2 story house with 14 steps to the second level.  Will not have very much assistance at discharge. May have to stay on the first level until he can negotiate the steps.     OT Assessment  Patient needs continued OT Services    Follow Up Recommendations  No OT follow up    Barriers to Discharge Inaccessible home environment    Equipment Recommendations  3 in 1 bedside comode       Frequency  Min 2X/week    Precautions / Restrictions Precautions Precautions: Anterior Hip Restrictions Weight Bearing Restrictions: No LLE Weight Bearing: Weight bearing as tolerated   Pertinent Vitals/Pain   BP 116/45    ADL  Eating/Feeding: Simulated;Independent Where Assessed - Eating/Feeding: Chair Grooming: Performed;Teeth care;Supervision/safety Where Assessed - Grooming: Supported sitting Upper Body Bathing: Simulated;Set up Where Assessed - Upper Body Bathing: Unsupported sitting Lower Body Bathing: Simulated;Minimal assistance Where Assessed - Lower Body Bathing: Supported sit to stand Upper Body Dressing: Simulated;Set up Where Assessed - Upper Body Dressing: Unsupported sitting Lower Body Dressing: Simulated;Minimal assistance Where Assessed - Lower Body Dressing: Supported sit to stand Toilet Transfer: Simulated;Min guard Statistician Method: Surveyor, minerals: Other (comment) (Pt declined need to  toilet.) Toileting - Clothing Manipulation and Hygiene: Simulated;Min guard Where Assessed - Glass blower/designer Manipulation and Hygiene: Other (comment) (sit to stand from the EOB) Tub/Shower Transfer Method: Not assessed Transfers/Ambulation Related to ADLs: Pt min guard assist for stand pivot transfer to bedside chair using RW. ADL Comments: Pt more limited secondary to nausea and indigestion. Pt ives alone in two story house.  No bedroom on the first level.  Had a posterior hip replacement in 2011 and was able to negotiate the steps after 4 days.  Plans to go home alone and use the steps to get to this bedroom and shower.    OT Diagnosis: Generalized weakness;Acute pain  OT Problem List: Decreased strength;Decreased activity tolerance;Impaired balance (sitting and/or standing);Decreased knowledge of use of DME or AE;Pain;Decreased knowledge of precautions OT Treatment Interventions: Self-care/ADL training;Therapeutic activities;DME and/or AE instruction;Patient/family education;Balance training   OT Goals Acute Rehab OT Goals OT Goal Formulation: With patient ADL Goals Pt Will Perform Grooming: with modified independence;Unsupported;Standing at sink ADL Goal: Grooming - Progress: Goal set today Pt Will Perform Upper Body Bathing: with modified independence;Unsupported;Sitting, edge of bed ADL Goal: Upper Body Bathing - Progress: Goal set today Pt Will Perform Lower Body Bathing: with modified independence;Sit to stand from chair;with adaptive equipment ADL Goal: Lower Body Bathing - Progress: Goal set today Pt Will Perform Upper Body Dressing: with modified independence;Sitting, bed;Unsupported ADL Goal: Upper Body Dressing - Progress: Goal set today Pt Will Perform Lower Body Dressing: with modified independence;Sit to stand from bed;with adaptive equipment ADL Goal: Lower Body Dressing - Progress: Goal set today Pt Will Transfer to Toilet: with modified independence;with  DME;3-in-1;Maintaining hip precautions ADL Goal: Toilet Transfer - Progress: Goal set today Pt Will Perform Tub/Shower Transfer: Shower transfer;with modified independence;Ambulation;Shower seat  with back ADL Goal: Tub/Shower Transfer - Progress: Goal set today  Visit Information  Last OT Received On: 04/08/12 Assistance Needed: +1    Subjective Data  Subjective: "I just feel so nauseas and the indigestion is bad." Patient Stated Goal: To get back to work and playing golf.   Prior Functioning     Home Living Lives With: Alone Available Help at Discharge: Friend(s);Other (Comment) (may help some if needed) Type of Home: House Home Access: Stairs to enter Entrance Stairs-Number of Steps: 3-4 Entrance Stairs-Rails: Right;Left Home Layout: Two level;1/2 bath on main level;Other (Comment) (no bedroom on the first level) Alternate Level Stairs-Number of Steps: 14       6 steps than a landing  and 8 more Alternate Level Stairs-Rails: Right Bathroom Shower/Tub: Health visitor: Standard Bathroom Accessibility: Yes How Accessible: Accessible via walker Home Adaptive Equipment: Walker - rolling;Straight cane Prior Function Level of Independence: Independent Able to Take Stairs?: Reciprically Driving: Yes Vocation: Full time employment Communication Communication: No difficulties Dominant Hand: Right         Vision/Perception Vision - Assessment Vision Assessment: Vision not tested Perception Perception: Within Functional Limits Praxis Praxis: Intact   Cognition  Overall Cognitive Status: Appears within functional limits for tasks assessed/performed Arousal/Alertness: Awake/alert Orientation Level: Appears intact for tasks assessed Behavior During Session: [facility] for tasks performed    Extremity/Trunk Assessment Right Upper Extremity Assessment RUE ROM/Strength/Tone: Within functional levels Left Upper Extremity Assessment LUE ROM/Strength/Tone: Within  functional levels Trunk Assessment Trunk Assessment: Normal     Mobility Bed Mobility Bed Mobility: Supine to Sit Supine to Sit: 4: Min assist;HOB elevated;With rails           Balance Balance Balance Assessed: Yes Dynamic Standing Balance Dynamic Standing - Balance Support: Right upper extremity supported;Left upper extremity supported Dynamic Standing - Level of Assistance: 5: Stand by assistance   End of Session OT - End of Session Activity Tolerance: Other (comment) (limited secondary to nausea and indigestion) Patient left: in chair;with call bell/phone within reach Nurse Communication: Mobility status      Adianna Darwin OTR/L Pager number [phone] 04/08/2012, 10:23 AM

## 2012-04-08 NOTE — Progress Notes (Signed)
Agree with PT treatment note.  Kaleth Koy, PT DPT [phone]  

## 2012-04-08 NOTE — Progress Notes (Signed)
Physical Therapy Evaluation Note:  04/08/12 1015  PT Visit Information  Last PT Received On 04/08/12  Assistance Needed +1  PT Time Calculation  PT Start Time 1016  PT Stop Time 1033  PT Time Calculation (min) 17 min  Subjective Data  Subjective I am not feeling well.  Patient Stated Goal To feel better.  Precautions  Precautions Anterior Hip  Precaution Booklet Issued Yes (comment)  Restrictions  Weight Bearing Restrictions Yes  LLE Weight Bearing WBAT  Home Living  Lives With Alone  Available Help at Discharge Friend(s);Other (Comment)  Type of Home House  Home Access Stairs to enter  Entrance Stairs-Number of Steps 3-4  Entrance Stairs-Rails Right;Left  Home Layout Two level;1/2 bath on main level;Other (Comment)  Alternate Level Stairs-Number of Steps 14       6 steps than a landing  and 8 more  Alternate Level Stairs-Rails Right  Bathroom Shower/Tub Walk-in Pension scheme manager Yes  How Accessible Accessible via walker  Home Adaptive Equipment Walker - rolling;Straight cane  Prior Function  Level of Independence Independent  Able to Take Stairs? Reciprically  Driving Yes  Vocation Full time employment  Communication  Communication No difficulties  Cognition  Overall Cognitive Status Appears within functional limits for tasks assessed/performed  Arousal/Alertness Awake/alert  Orientation Level Appears intact for tasks assessed  Behavior During Session [facility] for tasks performed  Bed Mobility  Bed Mobility Not assessed  Transfers  Transfers Not assessed  Ambulation/Gait  Ambulation/Gait Assistance Not tested (comment)  Stairs No  Wheelchair Mobility  Wheelchair Mobility No  Balance  Balance Assessed No  Exercises  Exercises Total Joint  Total Joint Exercises  Ankle Circles/Pumps AROM;Both;10 reps  Quad Sets AROM;10 reps;Both  Short Arc Quad AROM;Both;10 reps  Heel Slides AAROM;5 reps;Left  PT - End of Session    Activity Tolerance Treatment limited secondary to medical complications (Comment) (nauseous )  Patient left in chair;with call bell/phone within reach;with nursing in room  Nurse Communication Mobility status  PT Assessment  Clinical Impression Statement Pt is 78 y.o. M s/p L THA.  Pt c/o nausea and heart burn.  Plan for next session is to ambulate and perform transfers.  PT Recommendation/Assessment Patient needs continued PT services  PT Problem List Decreased strength;Decreased range of motion;Decreased activity tolerance;Decreased balance;Decreased mobility;Decreased knowledge of use of DME;Decreased safety awareness;Pain  Barriers to Discharge Decreased caregiver support  PT Therapy Diagnosis  Difficulty walking;Abnormality of gait;Generalized weakness;Acute pain  PT Plan  PT Frequency 7X/week  PT Treatment/Interventions DME instruction;Gait training;Stair training;Functional mobility training;Therapeutic activities;Therapeutic exercise;Balance training;Neuromuscular re-education;Patient/family education  PT Recommendation  Recommendations for Other Services Other (comment) (None)  Follow Up Recommendations Home health PT  Equipment Recommended 3 in 1 bedside comode  Individuals Consulted  Consulted and Agree with Results and Recommendations Patient  Acute Rehab PT Goals  PT Goal Formulation With patient  Time For Goal Achievement 04/15/12  Potential to Achieve Goals Good  Pt will go Supine/Side to Sit with modified independence;with HOB 0 degrees  PT Goal: Supine/Side to Sit - Progress Goal set today  Pt will go Sit to Supine/Side with modified independence;with HOB 0 degrees  PT Goal: Sit to Supine/Side - Progress Goal set today  Pt will go Sit to Stand with modified independence;with upper extremity assist  PT Goal: Sit to Stand - Progress Goal set today  Pt will go Stand to Sit with modified independence;with upper extremity assist  PT Goal: Stand to Sit -  Progress Goal set  today  Pt will Ambulate >150 feet;with modified independence;with least restrictive assistive device  PT Goal: Ambulate - Progress Goal set today  Pt will Go Up / Down Stairs Flight;with modified independence;with least restrictive assistive device  PT Goal: Up/Down Stairs - Progress Goal set today  Pt will Perform Home Exercise Program Independently  PT Goal: Perform Home Exercise Program - Progress Goal set today  Written Expression  Dominant Hand Right    Pain in L LE did not rate.  Jasaiah Karwowski, SPT

## 2012-04-08 NOTE — Progress Notes (Signed)
PHARMACIST - PHYSICIAN ORDER COMMUNICATION  CONCERNING: P&T Medication Policy on Herbal Medications DESCRIPTION:  This patient's order for:  Potassium Gluconate 550mg   has been noted.  This product(s) is classified as an "herbal" or natural product. Due to a lack of definitive safety studies or FDA approval, nonstandard manufacturing practices, plus the potential risk of unknown drug-drug interactions while on inpatient medications, the Pharmacy and Therapeutics Committee does not permit the use of "herbal" or natural products of this type within [facility].   ACTION TAKEN: The pharmacy department is unable to verify this order at this time and your patient has been informed of this safety policy. Please reevaluate patient's clinical condition at discharge and address if the herbal or natural product(s) should be resumed at that time.    Noah Delaine, RPh Clinical Pharmacist Pager: [phone] 04/08/2012 14:36 PM

## 2012-04-08 NOTE — Progress Notes (Signed)
Physical Therapy Progress Note   04/08/12 1400  PT Visit Information  Last PT Received On 04/08/12  Assistance Needed +1  PT Time Calculation  PT Start Time 1431  PT Stop Time 1447  PT Time Calculation (min) 16 min  Subjective Data  Subjective I am really not feeling well and I am in a lot of pain.  Patient Stated Goal To be pain free.  Precautions  Precautions Anterior Hip  Restrictions  Weight Bearing Restrictions Yes  LLE Weight Bearing WBAT  Cognition  Overall Cognitive Status Appears within functional limits for tasks assessed/performed  Arousal/Alertness Awake/alert  Orientation Level Appears intact for tasks assessed  Behavior During Session [facility] for tasks performed  Bed Mobility  Bed Mobility Supine to Sit;Sitting - Scoot to [place] of Bed;Sit to Supine  Supine to Sit 4: Min assist;With rails;HOB elevated  Sitting - Scoot to [place] of Bed 4: Min assist;With rail  Sit to Supine 4: Min assist;With rail;HOB elevated  Details for Bed Mobility Assistance (A) for supporting L LE.  Max cues for proper technique, hip precautions, hand placement, and safety awareness.  Transfers  Transfers Sit to Stand;Stand to Sit  Sit to Stand 4: Min assist;With upper extremity assist;From bed  Stand to Sit 4: Min assist;With upper extremity assist;To bed  Details for Transfer Assistance (A) with balance and initiating mvt.  Cues for proper technique and hand placement.  Ambulation/Gait  Ambulation/Gait Assistance 4: Min assist  Ambulation Distance (Feet) 30 Feet  Assistive device Rolling walker  Ambulation/Gait Assistance Details (A) with RW placement and balance.  Cues for posture, proper technique, precautions, and safety awareness.  Gait Pattern Step-to pattern;Decreased stride length;Antalgic  Gait velocity decreased  Stairs No  Wheelchair Mobility  Wheelchair Mobility No  Balance  Balance Assessed No  PT - End of Session  Activity Tolerance Treatment limited secondary to medical  complications (Comment)  Patient left in bed;with call bell/phone within reach  Nurse Communication Mobility status  PT - Assessment/Plan  Comments on Treatment Session Pt c/o heart burn and pain.  Pain was a limiting factor during this session. Due to pain and limited progress pt may benefit from rehabilitation services prior d/c home.  Will continue to monitor. Plan for next session is to increase ambulation distance and stair negotiation.  PT Plan Discharge plan remains appropriate;Frequency remains appropriate  PT Frequency 7X/week  Recommendations for Other Services Other (comment) (None)  Follow Up Recommendations Home health PT  Equipment Recommended 3 in 1 bedside comode  Acute Rehab PT Goals  PT Goal Formulation With patient  Time For Goal Achievement 04/15/12  Potential to Achieve Goals Good  Pt will go Supine/Side to Sit with modified independence;with HOB 0 degrees  PT Goal: Supine/Side to Sit - Progress Not met  Pt will go Sit to Supine/Side with modified independence;with HOB 0 degrees  PT Goal: Sit to Supine/Side - Progress Not met  Pt will go Sit to Stand with modified independence;with upper extremity assist  PT Goal: Sit to Stand - Progress Not met  Pt will go Stand to Sit with modified independence;with upper extremity assist  PT Goal: Stand to Sit - Progress Not met  Pt will Ambulate >150 feet;with modified independence;with least restrictive assistive device  PT Goal: Ambulate - Progress Progressing toward goal    8/10 pain L LE.  Jyllian Haynie, SPT

## 2012-04-09 ENCOUNTER — Encounter (HOSPITAL_COMMUNITY): Payer: Self-pay | Admitting: Orthopaedic Surgery

## 2012-04-09 LAB — CBC
HCT: 28.7 % — ABNORMAL LOW (ref 39.0–52.0)
Hemoglobin: 9.8 g/dL — ABNORMAL LOW (ref 13.0–17.0)
RBC: 3.06 MIL/uL — ABNORMAL LOW (ref 4.22–5.81)
WBC: 13.7 10*3/uL — ABNORMAL HIGH (ref 4.0–10.5)

## 2012-04-09 MED ORDER — ASPIRIN 325 MG PO TBEC: 325.0000 mg | DELAYED_RELEASE_TABLET | Freq: Every day | ORAL | Status: DC

## 2012-04-09 MED ORDER — PANTOPRAZOLE SODIUM 40 MG PO TBEC: 40.0000 mg | DELAYED_RELEASE_TABLET | Freq: Every day | ORAL | Status: DC

## 2012-04-09 MED ORDER — OXYCODONE HCL 5 MG PO TABS: 5.0000 mg | ORAL_TABLET | ORAL | Status: DC | PRN

## 2012-04-09 MED ORDER — METHOCARBAMOL 500 MG PO TABS: 500.0000 mg | ORAL_TABLET | Freq: Four times a day (QID) | ORAL | Status: DC | PRN

## 2012-04-09 NOTE — Progress Notes (Signed)
Agree with PT treatment note.  Taelar Gronewold, PT DPT [phone]  

## 2012-04-09 NOTE — Progress Notes (Signed)
Physical Therapy Progress Note   04/09/12 1100  PT Visit Information  Last PT Received On 04/09/12  Assistance Needed +1  PT Time Calculation  PT Start Time 1102  PT Stop Time 1114  PT Time Calculation (min) 12 min  Subjective Data  Subjective I am ready to go home.  I need to know for my friends to come get me.  Patient Stated Goal To go home today.  Precautions  Precautions Anterior Hip  Restrictions  Weight Bearing Restrictions Yes  LLE Weight Bearing WBAT  Cognition  Overall Cognitive Status Appears within functional limits for tasks assessed/performed  Arousal/Alertness Awake/alert  Orientation Level Appears intact for tasks assessed  Behavior During Session [facility] for tasks performed  Bed Mobility  Bed Mobility Not assessed  Transfers  Transfers Sit to Stand;Stand to Sit  Sit to Stand 4: Min guard;With upper extremity assist;From chair/3-in-1  Stand to Sit 4: Min guard;To chair/3-in-1;With upper extremity assist  Details for Transfer Assistance Cues for safety  Ambulation/Gait  Ambulation/Gait Assistance 4: Min guard  Ambulation Distance (Feet) 60 Feet  Assistive device Rolling walker  Ambulation/Gait Assistance Details Cues for posture and safety  Gait Pattern Step-to pattern;Decreased stride length;Antalgic  Gait velocity decreased  Stairs No  Wheelchair Mobility  Wheelchair Mobility No  Balance  Balance Assessed No  PT - End of Session  Equipment Utilized During Treatment Gait belt  Activity Tolerance Patient tolerated treatment well;Patient limited by pain  Patient left in chair;with call bell/phone within reach  Nurse Communication Mobility status  PT - Assessment/Plan  Comments on Treatment Session Pt increased ambulation distance.  Pt stated sleeping in couch for the first night due to pain with stair negotiation.  Pt upset about slow process of discharge and is determined to do exercises and ambulation his way.  Pt is ready for d/c when deemed medically  safe.  Nurse was instructed about d/c and IV pain status.  PT Plan Discharge plan remains appropriate;Frequency remains appropriate  PT Frequency 7X/week  Recommendations for Other Services Other (comment) (None)  Follow Up Recommendations Home health PT  Equipment Recommended 3 in 1 bedside comode  Acute Rehab PT Goals  PT Goal Formulation With patient  Time For Goal Achievement 04/15/12  Potential to Achieve Goals Good  Pt will go Sit to Stand with modified independence;with upper extremity assist  PT Goal: Sit to Stand - Progress Progressing toward goal  Pt will go Stand to Sit with modified independence;with upper extremity assist  PT Goal: Stand to Sit - Progress Progressing toward goal  Pt will Ambulate >150 feet;with modified independence;with least restrictive assistive device  PT Goal: Ambulate - Progress Progressing toward goal  PT General Charges  $$ ACUTE PT VISIT 1 Procedure  PT Treatments  $Gait Training 8-22 mins      [place], Holly DPT [phone]

## 2012-04-09 NOTE — Progress Notes (Signed)
All d/c instructions given and explained to pt.  Verbalized understanding.  D/c home .

## 2012-04-09 NOTE — Progress Notes (Signed)
Patient ID: Jorge Smith, male   DOB: 11-Jan-1933, 78 y.o.   MRN: [id number]   Good BM. Pain worse anterior left exposure vs. Patients memory of right posterior hip exposure for THA.  Likely home today after stairs.

## 2012-04-09 NOTE — Progress Notes (Signed)
Physical Therapy Treatment Patient Details Name: Jorge Smith MRN: [id number] DOB: Apr 06, 1933 Today's Date: 04/09/2012 Time: 4098-1191 PT Time Calculation (min): 28 min  PT Assessment / Plan / Recommendation Comments on Treatment Session  Pt able to perform stair negotiation but was limited due to pain.  Pt seemed upset and was wanting to go home.  Pt was impulsive during therapy session and had increased pain during the descent of stairs.  Plan for next session is to increase ambulation distance prior to d/c home.    Follow Up Recommendations  Home health PT     Does the patient have the potential to tolerate intense rehabilitation     Barriers to Discharge        Equipment Recommendations  3 in 1 bedside comode    Recommendations for Other Services Other (comment) (None)  Frequency 7X/week   Plan Discharge plan remains appropriate;Frequency remains appropriate    Precautions / Restrictions Precautions Precautions: Anterior Hip Restrictions Weight Bearing Restrictions: Yes LLE Weight Bearing: Weight bearing as tolerated   Pertinent Vitals/Pain 10/10 pain in L LE after stair negotiation.    Mobility  Bed Mobility Bed Mobility: Not assessed Transfers Transfers: Sit to Stand;Stand to Sit Sit to Stand: 4: Min guard;With upper extremity assist;From chair/3-in-1 Stand to Sit: 4: Min guard;To chair/3-in-1;With upper extremity assist Details for Transfer Assistance: Cues for safety and RW placement. Ambulation/Gait Ambulation/Gait Assistance: 4: Min guard Ambulation Distance (Feet): 20 Feet Assistive device: Rolling walker Ambulation/Gait Assistance Details: Cues for posture and safety. Gait Pattern: Step-to pattern;Decreased stride length;Antalgic Gait velocity: decreased Stairs: Yes Stairs Assistance: 4: Min assist Stairs Assistance Details (indicate cue type and reason): (A) with balance.  Cues for proper technique and safety. Stair Management Technique: One rail  Right;Step to pattern;Forwards (2 hand on one rail) Number of Stairs: 12  Wheelchair Mobility Wheelchair Mobility: No    Exercises Total Joint Exercises Quad Sets: AROM;Left;5 reps Heel Slides: AAROM;5 reps;Left Long Arc Quad: AROM;Left;5 reps   PT Diagnosis:    PT Problem List:   PT Treatment Interventions:     PT Goals Acute Rehab PT Goals PT Goal Formulation: With patient Time For Goal Achievement: 04/15/12 Potential to Achieve Goals: Good Pt will go Sit to Stand: with modified independence;with upper extremity assist PT Goal: Sit to Stand - Progress: Progressing toward goal Pt will go Stand to Sit: with modified independence;with upper extremity assist PT Goal: Stand to Sit - Progress: Progressing toward goal Pt will Ambulate: >150 feet;with modified independence;with least restrictive assistive device PT Goal: Ambulate - Progress: Progressing toward goal Pt will Go Up / Down Stairs: Flight;with modified independence;with least restrictive assistive device PT Goal: Up/Down Stairs - Progress: Progressing toward goal Pt will Perform Home Exercise Program: Independently PT Goal: Perform Home Exercise Program - Progress: Progressing toward goal  Visit Information  Last PT Received On: 04/09/12 Assistance Needed: +1    Subjective Data  Subjective: I am ready to go home.  When can this IV be taken out? Patient Stated Goal: To go home today.   Cognition  Overall Cognitive Status: Appears within functional limits for tasks assessed/performed Arousal/Alertness: Awake/alert Orientation Level: Appears intact for tasks assessed Behavior During Session: [facility] for tasks performed    Balance  Balance Balance Assessed: No  End of Session PT - End of Session Equipment Utilized During Treatment: Gait belt Activity Tolerance: Patient tolerated treatment well;Patient limited by pain Patient left: in chair;with call bell/phone within reach Nurse Communication: Mobility status  GP      Matther Labell 04/09/2012, 10:42 AM

## 2012-04-11 ENCOUNTER — Inpatient Hospital Stay (HOSPITAL_COMMUNITY): Admission: RE | Admit: 2012-04-11 | Payer: Medicare Other | Source: Ambulatory Visit | Admitting: Orthopaedic Surgery

## 2012-04-11 ENCOUNTER — Encounter (HOSPITAL_COMMUNITY): Admission: RE | Payer: Self-pay | Source: Ambulatory Visit

## 2012-04-11 SURGERY — ARTHROPLASTY, HIP, TOTAL, ANTERIOR APPROACH
Anesthesia: General | Site: Hip | Laterality: Left

## 2012-04-14 NOTE — Discharge Summary (Signed)
Physician Discharge Summary  Patient ID: Jorge Smith MRN: [id number] DOB/AGE: 76-13-1934 76 y.o.  Admit date: 04/07/2012 Discharge date: 04/14/2012  Admission Diagnoses:  Osteoarthritis of left hip  Discharge Diagnoses:  Principal Problem:  *Osteoarthritis of left hip   Past Medical History  Diagnosis Date  . 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 Hypertension   . Hyperlipidemia   . Chest pain     HX OF  . PVD (peripheral vascular disease)     claudication  . Hx of colonic polyps   . Polymyalgia rheumatica   . BPH (benign prostatic hypertrophy)   . Low back pain   . Pneumonia 2001  . Arthritis   . Diabetes mellitus     no meds and pt denies having diabetes.   Marland Kitchen GERD (gastroesophageal reflux disease)     Surgeries: Procedure(s): TOTAL HIP ARTHROPLASTY ANTERIOR APPROACH on 04/07/2012. left   Consultants (if any):  none  Discharged Condition: Improved  Hospital Course: Jorge Smith is an 78 y.o. male who was admitted 04/07/2012 with a diagnosis of Osteoarthritis of left hip and went to the operating room on 04/07/2012 and underwent the above named procedures.    He was given perioperative antibiotics:      Anti-infectives     Start     Dose/Rate Route Frequency Ordered Stop   04/07/12 1215   clindamycin (CLEOCIN) IVPB 900 mg  Status:  Discontinued        900 mg 100 mL/hr over 30 Minutes Intravenous To Surgery 04/07/12 1212 04/07/12 1214   04/07/12 1215   ceFAZolin (ANCEF) IVPB 2 g/50 mL premix        2 g 100 mL/hr over 30 Minutes Intravenous  Once 04/07/12 1215 04/07/12 1226        .  He was given sequential compression devices, early ambulation, and aspirin for DVT prophylaxis.  He benefited maximally from the hospital stay and there were no complications.    Recent vital signs:  Filed Vitals:   04/09/12 1015  BP: 116/50  Pulse: 64  Temp:   Resp:     Recent laboratory studies:  Lab  Results  Component Value Date   HGB 9.8* 04/09/2012   HGB 11.2* 04/08/2012   HGB 13.3 04/01/2012   Lab Results  Component Value Date   WBC 13.7* 04/09/2012   PLT 227 04/09/2012   Lab Results  Component Value Date   INR 1.03 04/01/2012   Lab Results  Component Value Date   NA 133* 04/08/2012   K 4.4 04/08/2012   CL 101 04/08/2012   CO2 23 04/08/2012   BUN 16 04/08/2012   CREATININE 0.92 04/08/2012   GLUCOSE 178* 04/08/2012    Discharge Medications:     Medication List     As of 04/14/2012 10:13 AM    STOP taking these medications         aspirin 81 MG tablet      TAKE these medications         ADVIL PM 200-25 MG Caps   Generic drug: Ibuprofen-Diphenhydramine HCl   Take 2 tablets by mouth at bedtime.      aspirin 325 MG EC tablet   Take 1 tablet (325 mg total) by mouth daily with breakfast.      benazepril 40 MG tablet   Commonly known as: LOTENSIN   Take 1 tablet (40 mg total) by mouth daily.  diphenhydramine-acetaminophen 25-500 MG Tabs   Commonly known as: TYLENOL PM   Take 2 tablets by mouth at bedtime as needed.      docusate sodium 100 MG capsule   Commonly known as: COLACE   Take 100 mg by mouth every morning.      FIBER LAXATIVE 0.52 G capsule   Generic drug: psyllium   Take 0.52 g by mouth daily.      Glucosamine Chondroitin Complx Tabs   Take 1 tablet by mouth daily.      ibuprofen 200 MG tablet   Commonly known as: ADVIL,MOTRIN   Take 200 mg by mouth every 6 (six) hours as needed. For pain      methocarbamol 500 MG tablet   Commonly known as: ROBAXIN   Take 1 tablet (500 mg total) by mouth every 6 (six) hours as needed.      multivitamin tablet   Take 1 tablet by mouth daily.      naproxen sodium 220 MG tablet   Commonly known as: ANAPROX   Take 440 mg by mouth every morning.      oxyCODONE 5 MG immediate release tablet   Commonly known as: Oxy IR/ROXICODONE   Take 1-2 tablets (5-10 mg total) by mouth every 3 (three) hours  as needed.      Potassium Gluconate 550 MG Tabs   Take 1 tablet by mouth daily.      simvastatin 40 MG tablet   Commonly known as: ZOCOR   Take 1 tablet (40 mg total) by mouth at bedtime.      Super B Complex Tabs   Take 1 tablet by mouth daily.          Diagnostic Studies: Dg Chest 2 View  04/01/2012  *RADIOLOGY REPORT*  Clinical Data: Preop hip arthroplasty  CHEST - 2 VIEW  Comparison: 01/19/2010  Findings: Pulmonary hyperinflation with changes of COPD.  Negative for pneumonia.  Negative for mass lesion.  Bilateral nipple shadows are noted.  No pleural effusion.  IMPRESSION: COPD.  No acute cardiopulmonary process.   Original Report Authenticated By: Camelia Phenes, M.D.    Dg Hip Operative Left  04/07/2012  *RADIOLOGY REPORT*  Clinical Data: Osteoarthritis of the left hip.  OPERATIVE LEFT HIP  Comparison: None.  Findings: AP C-arm images demonstrate that the acetabular and femoral components of the new left total hip prosthesis appear in excellent position in the AP projection.  No fractures.  IMPRESSION: Excellent position of the left total hip components in the AP projection.   Original Report Authenticated By: Gwynn Burly, M.D.    Dg Hip Portable 1 View Left  04/07/2012  *RADIOLOGY REPORT*  Clinical Data: Osteoarthritis of the left hip.  Left total hip replacement.  PORTABLE LEFT HIP - 1 VIEW  Comparison: C-arm images dated 04/07/2012 and abdomen radiograph dated 01/17/2010  Findings: The components of the left total hip prosthesis appear in good position.  No fracture.  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  IMPRESSION: Satisfactory appearance of the left hip after total hip replacement.   Original Report Authenticated By: Gwynn Burly, M.D.     Disposition: 06-Home-Health Care Svc  Discharge Orders    Future Orders Please Complete By Expires   Diet - low sodium heart healthy      Call MD / Call 911        Comments:   If you experience chest pain  or shortness of breath, CALL 911 and be transported to the hospital emergency room.  If you develope a fever above 101 F, pus (white drainage) or increased drainage or redness at the wound, or calf pain, call your surgeon's office.   Constipation Prevention      Comments:   Drink plenty of fluids.  Prune juice may be helpful.  You may use a stool softener, such as Colace (over the counter) 100 mg twice a day.  Use MiraLax (over the counter) for constipation as needed.   Increase activity slowly as tolerated      Discharge instructions      Comments:   Change dressing daily or as needed.  Walk as tolerated.  Ice to hip as needed for pain.  Aspirin for blood clot prevention.   Follow the hip precautions as taught in Physical Therapy         Follow-up Information    Follow up with YATES,MARK C, MD. In 2 weeks.   Contact information:   [address] [place] [address] [phone]           Signed: Wende Neighbors 04/14/2012, 10:13 AM

## 2012-04-15 ENCOUNTER — Ambulatory Visit (HOSPITAL_COMMUNITY)
Admission: RE | Admit: 2012-04-15 | Discharge: 2012-04-15 | Disposition: A | Discharge status: Home / Self Care | Payer: Medicare Other | Source: Ambulatory Visit | Attending: Orthopaedic Surgery | Admitting: Orthopaedic Surgery

## 2012-04-15 DIAGNOSIS — M7989 Other specified soft tissue disorders: Secondary | ICD-10-CM

## 2012-04-15 DIAGNOSIS — M79609 Pain in unspecified limb: Secondary | ICD-10-CM

## 2012-04-15 DIAGNOSIS — R609 Edema, unspecified: Secondary | ICD-10-CM

## 2012-04-15 DIAGNOSIS — R52 Pain, unspecified: Secondary | ICD-10-CM

## 2012-08-10 ENCOUNTER — Other Ambulatory Visit: Payer: Self-pay | Admitting: Internal Medicine

## 2012-09-09 ENCOUNTER — Encounter: Payer: Self-pay | Admitting: Internal Medicine

## 2012-09-09 ENCOUNTER — Ambulatory Visit (INDEPENDENT_AMBULATORY_CARE_PROVIDER_SITE_OTHER): Payer: Medicare Other | Admitting: Internal Medicine

## 2012-09-09 VITALS — BP 160/90 | HR 72 | Temp 97.4°F | Resp 18 | Ht 68.0 in | Wt 183.0 lb

## 2012-09-09 DIAGNOSIS — E119 Type 2 diabetes mellitus without complications: Secondary | ICD-10-CM

## 2012-09-09 DIAGNOSIS — Z0000 Encounter for general adult medical examination without abnormal findings: Secondary | ICD-10-CM

## 2012-09-09 DIAGNOSIS — E785 Hyperlipidemia, unspecified: Secondary | ICD-10-CM

## 2012-09-09 DIAGNOSIS — I739 Peripheral vascular disease, unspecified: Secondary | ICD-10-CM

## 2012-09-09 DIAGNOSIS — N400 Enlarged prostate without lower urinary tract symptoms: Secondary | ICD-10-CM

## 2012-09-09 DIAGNOSIS — I10 Essential (primary) hypertension: Secondary | ICD-10-CM

## 2012-09-09 MED ORDER — ASPIRIN 81 MG PO TBEC: 81.0000 mg | DELAYED_RELEASE_TABLET | Freq: Every day | ORAL | Status: AC

## 2012-09-09 MED ORDER — CILOSTAZOL 100 MG PO TABS: ORAL_TABLET | ORAL | Status: DC

## 2012-09-09 MED ORDER — SIMVASTATIN 40 MG PO TABS: ORAL_TABLET | ORAL | Status: DC

## 2012-09-09 MED ORDER — BENAZEPRIL HCL 40 MG PO TABS: ORAL_TABLET | ORAL | Status: DC

## 2012-09-09 NOTE — Patient Instructions (Signed)
Limit your sodium (Salt) intake    It is important that you exercise regularly, at least 20 minutes 3 to 4 times per week.  If you develop chest pain or shortness of breath seek  medical attention.  Please check your blood pressure on a regular basis.  If it is consistently greater than 150/90, please make an office appointment.  Return in 6 months for follow-up   

## 2012-09-09 NOTE — Progress Notes (Signed)
Patient ID: Jorge Smith, male   DOB: 1932/07/02, 79 y.o.   MRN: [id number]  Subjective:    Patient ID: Jorge Smith, male    DOB: 1932/08/15, 79 y.o.   MRN: [id number]  HPI  CC: cpx - doing well and labs done.  History of Present Illness:   79 year-old patient seen today for a comprehensive evaluation. medical problems include BPH, history of polymyalgia rheumatica, impaired glucose tolerance, hypertension, and dyslipidemia. He has a history PA D., which has been stable. He has osteoarthritis and is status post bilateral hip replacement. He is followed by a number of consultants.  He is scheduled for a urology followup next month and also has a high examination scheduled. He is followed by cardiology and is status post ablation.  Past Medical History  Diagnosis Date  . 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 Hypertension   . Hyperlipidemia   . Chest pain     HX OF  . PVD (peripheral vascular disease)     claudication  . Hx of colonic polyps   . Polymyalgia rheumatica   . BPH (benign prostatic hypertrophy)   . Low back pain   . Pneumonia 2001  . Arthritis   . Diabetes mellitus     no meds and pt denies having diabetes.   Marland Kitchen GERD (gastroesophageal reflux disease)     History   Social History  . Marital Status: Divorced    Spouse Name: N/A    Number of Children: 2  . Years of Education: N/A   Occupational History  . furniture sales    Social History Main Topics  . Smoking status: Former Smoker    Types: Cigarettes    Quit date: 06/25/1998  . Smokeless tobacco: Never Used  . Alcohol Use: 3.0 oz/week    6 drink(s) per week     Comment: one 3-4 times per week  . Drug Use: No  . Sexually Active: Not on file   Other Topics Concern  . Not on file   Social History Narrative  . No narrative on file    Past Surgical History  Procedure Laterality Date  . Carpal tunnel release  2010  . Hernia repair      x 2  .  Cholecystectomy    . Total hip arthroplasty  2011    complicated by postop ilieus  . Atrial ablation surgery  11/23/10    slow pathway modification for AVNRT by JA  . Total hip arthroplasty  04/08/2012    LEFT  . Total hip arthroplasty  04/07/2012    Procedure: TOTAL HIP ARTHROPLASTY ANTERIOR APPROACH;  Surgeon: Eldred Manges, MD;  Location: [facility] OR;  Service: Orthopedics;  Laterality: Left;  Left Total Hip Arthroplasty, Anterior Approach    Family History  Problem Relation Age of Onset  . Other Mother 4    died in a fire  . Heart attack Father 49  . Heart attack Other 20    paternal grandfather  . Other Sister 77    had nephrectomy alive and well    Allergies  Allergen Reactions  . Penicillins     "very high fever"    Current Outpatient Prescriptions on File Prior to Visit  Medication Sig Dispense Refill  . aspirin EC 325 MG EC tablet Take 1 tablet (325 mg total) by mouth daily with breakfast.  30 tablet    . B Complex-C (SUPER B  COMPLEX) TABS Take 1 tablet by mouth daily.        . benazepril (LOTENSIN) 40 MG tablet TAKE 1 TABLET EVERY DAY  90 tablet  3  . cilostazol (PLETAL) 100 MG tablet TAKE 1/2 TABLET TWICE A DAY FOR 2 WEEKS THEN 1 TABLET TWICE A DAY THEREAFTER  180 tablet  1  . diphenhydramine-acetaminophen (TYLENOL PM) 25-500 MG TABS Take 2 tablets by mouth at bedtime as needed.      . docusate sodium (COLACE) 100 MG capsule Take 100 mg by mouth every morning.      . Glucosamine-Chondroit-Vit C-Mn (GLUCOSAMINE CHONDROITIN COMPLX) TABS Take 1 tablet by mouth daily.        Marland Kitchen ibuprofen (ADVIL,MOTRIN) 200 MG tablet Take 200 mg by mouth every 6 (six) hours as needed. For pain      . Ibuprofen-Diphenhydramine HCl (ADVIL PM) 200-25 MG CAPS Take 2 tablets by mouth at bedtime.       . Multiple Vitamin (MULTIVITAMIN) tablet Take 1 tablet by mouth daily.        . naproxen sodium (ANAPROX) 220 MG tablet Take 440 mg by mouth every morning.      . Potassium Gluconate 550 MG TABS Take 1  tablet by mouth daily.      . psyllium (FIBER LAXATIVE) 0.52 G capsule Take 0.52 g by mouth daily.       . simvastatin (ZOCOR) 40 MG tablet TAKE 1 TABLET AT BEDTIME  90 tablet  3   No current facility-administered medications on file prior to visit.    BP 160/90  Pulse 72  Temp(Src) 97.4 F (36.3 C) (Oral)  Resp 18  Ht 5\' 8"  (1.727 m)  Wt 183 lb (83.008 kg)  BMI 27.83 kg/m2  SpO2 97%      1. Risk factors, based on past  M,S,F history-  cardiovascular risk factors include hypertension dyslipidemia and impaired glucose tolerance  2.  Physical activities: Remains quite active but limited somewhat due to her left hip pain and mild claudication. He continues to work a 40 hour work week  3.  Depression/mood: History depression or mood disorder  4.  Hearing: No major deficits  5.  ADL's: Independent in all aspects of daily living  6.  Fall risk: Low  7.  Home safety: No problems identified  8.  Height weight, and visual acuity; height and weight stable uses soft contacts  9.  Counseling: regular exercise heart healthy diet all encouraged  10. Lab orders based on risk factors: Laboratory profile discussed  11. Referral : We'll continue to follow with cardiology ophthalmology urology and orthopedics  12. Care plan: Continue present regimen  13. Cognitive assessment: Alert and oriented with normal affect no cognitive dysfunction     Allergies:  1) ! Penicillin   Past History:  Past Medical History:   E.D.  GERD  Hyperlipidemia  arrythmia 2006  Low back pain  lumbar disc disease ([place])  hx of alcohol abuse  Peripheral vascular disease  Diabetes mellitus, type II - diet  Hypertension  Benign prostatic hypertrophy ( [place])  PMR ([place])  carpal tunnel syndrome  Colonic polyps, hx of  RBBB   Past Surgical History:  hernia x 2  Cholecystectomy  Carpal tunnel (Bilateral) 2010  colonoscopy 2007   Family History:  Reviewed history from 08/29/2008  and no changes required.  Father - MI in his 33's  No PAD or Stroke in the family   Social History:  Reviewed history from 07/05/2008  and no changes required.   Former Smoker discontinued and 10 years ago  Alcohol use-yes  Married but lives apart- third wife  6 children- ages 71 through 58  work - Control and instrumentation engineer  has lived in [place] for 25 years    Review of Systems  Constitutional: Negative for fever, chills, activity change, appetite change and fatigue.  HENT: Negative for hearing loss, ear pain, congestion, rhinorrhea, sneezing, mouth sores, trouble swallowing, neck pain, neck stiffness, dental problem, voice change, sinus pressure and tinnitus.   Eyes: Negative for photophobia, pain, redness and visual disturbance.  Respiratory: Negative for apnea, cough, choking, chest tightness, shortness of breath and wheezing.   Cardiovascular: Negative for chest pain, palpitations and leg swelling.  Gastrointestinal: Negative for nausea, vomiting, abdominal pain, diarrhea, constipation, blood in stool, abdominal distention, anal bleeding and rectal pain.  Genitourinary: Negative for dysuria, urgency, frequency, hematuria, flank pain, decreased urine volume, discharge, penile swelling, scrotal swelling, difficulty urinating, genital sores and testicular pain.  Musculoskeletal: Positive for gait problem (mild claudication and left hip pain). Negative for myalgias, back pain, joint swelling and arthralgias.  Skin: Negative for color change, rash and wound.  Neurological: Negative for dizziness, tremors, seizures, syncope, facial asymmetry, speech difficulty, weakness, light-headedness, numbness and headaches.  Hematological: Negative for adenopathy. Does not bruise/bleed easily.  Psychiatric/Behavioral: Negative for suicidal ideas, hallucinations, behavioral problems, confusion, sleep disturbance, self-injury, dysphoric mood, decreased concentration and agitation. The patient is not nervous/anxious.         Objective:   Physical Exam  Constitutional: He appears well-developed and well-nourished.  HENT:  Head: Normocephalic and atraumatic.  Right Ear: External ear normal.  Left Ear: External ear normal.  Nose: Nose normal.  Mouth/Throat: Oropharynx is clear and moist.  Eyes: Conjunctivae and EOM are normal. Pupils are equal, round, and reactive to light. No scleral icterus.  Neck: Normal range of motion. Neck supple. No JVD present. No thyromegaly present.  Cardiovascular: Regular rhythm, normal heart sounds and intact distal pulses.  Exam reveals no gallop and no friction rub.   No murmur heard. Posterior tibial pulses are full. Dorsalis pedis pulses not easily palpable  Pulmonary/Chest: Effort normal. He has rales. He exhibits no tenderness.  A few bibasilar crackles  Abdominal: Soft. Bowel sounds are normal. He exhibits no distension and no mass. There is no tenderness.  Genitourinary: Prostate normal and penis normal. Guaiac negative stool.  Prostate enlarged with a more prominent left lobe  Musculoskeletal: Normal range of motion. He exhibits no edema and no tenderness.  Lymphadenopathy:    He has no cervical adenopathy.  Neurological: He is alert. He has normal reflexes. No cranial nerve deficit. Coordination normal.  Skin: Skin is warm and dry. No rash noted.  Psychiatric: He has a normal mood and affect. His behavior is normal.          Assessment & Plan:    Preventive health examination  Impaired glucose tolerance  we'll check a hemoglobin A1c BPH with ED Hypertension controlled Mild claudication  We'll continue present regimen Medicines refilled Recheck in one year or as needed

## 2012-12-08 ENCOUNTER — Encounter: Payer: Self-pay | Admitting: Internal Medicine

## 2012-12-08 ENCOUNTER — Ambulatory Visit (INDEPENDENT_AMBULATORY_CARE_PROVIDER_SITE_OTHER): Payer: Medicare Other | Admitting: Internal Medicine

## 2012-12-08 ENCOUNTER — Telehealth: Payer: Self-pay | Admitting: Internal Medicine

## 2012-12-08 VITALS — BP 160/70 | HR 119 | Temp 98.7°F | Resp 20 | Wt 171.0 lb

## 2012-12-08 DIAGNOSIS — J209 Acute bronchitis, unspecified: Secondary | ICD-10-CM

## 2012-12-08 DIAGNOSIS — E119 Type 2 diabetes mellitus without complications: Secondary | ICD-10-CM

## 2012-12-08 MED ORDER — AZITHROMYCIN 250 MG PO TABS: ORAL_TABLET | ORAL | Status: DC

## 2012-12-08 NOTE — Telephone Encounter (Signed)
Pt would like to let you know that he is going to come to the office and wait to see Dr Kirtland Bouchard now. Pt said to tell Dr Kirtland Bouchard : Jorge Smith is sick and needs top be seen before lunch.Marland Kitchen

## 2012-12-08 NOTE — Telephone Encounter (Signed)
Spoke with Dr. Kirtland Bouchard, states he will work patient in to his schedule.

## 2012-12-08 NOTE — Progress Notes (Signed)
Subjective:    Patient ID: Jorge Smith, male    DOB: September 30, 1932, 79 y.o.   MRN: [id number]  HPI   79 year old patient who presents with a three-day history of cough congestion. Cough has been mildly productive of yellow sputum. No shortness of breath or wheezing. He does have a history of hypertension and dyslipidemia. The patient worked Saturday and played golf yesterday.  Past Medical History  Diagnosis Date  . 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 Hypertension   . Hyperlipidemia   . Chest pain     HX OF  . PVD (peripheral vascular disease)     claudication  . Hx of colonic polyps   . Polymyalgia rheumatica   . BPH (benign prostatic hypertrophy)   . Low back pain   . Pneumonia 2001  . Arthritis   . Diabetes mellitus     no meds and pt denies having diabetes.   Marland Kitchen GERD (gastroesophageal reflux disease)     History   Social History  . Marital Status: Divorced    Spouse Name: N/A    Number of Children: 2  . Years of Education: N/A   Occupational History  . furniture sales    Social History Main Topics  . Smoking status: Former Smoker    Types: Cigarettes    Quit date: 06/25/1998  . Smokeless tobacco: Never Used  . Alcohol Use: 3.0 oz/week    6 drink(s) per week     Comment: one 3-4 times per week  . Drug Use: No  . Sexually Active: Not on file   Other Topics Concern  . Not on file   Social History Narrative  . No narrative on file    Past Surgical History  Procedure Laterality Date  . Carpal tunnel release  2010  . Hernia repair      x 2  . Cholecystectomy    . Total hip arthroplasty  2011    complicated by postop ilieus  . Atrial ablation surgery  11/23/10    slow pathway modification for AVNRT by JA  . Total hip arthroplasty  04/08/2012    LEFT  . Total hip arthroplasty  04/07/2012    Procedure: TOTAL HIP ARTHROPLASTY ANTERIOR APPROACH;  Surgeon: Eldred Manges, MD;  Location: [facility] OR;  Service:  Orthopedics;  Laterality: Left;  Left Total Hip Arthroplasty, Anterior Approach    Family History  Problem Relation Age of Onset  . Other Mother 33    died in a fire  . Heart attack Father 69  . Heart attack Other 73    paternal grandfather  . Other Sister 29    had nephrectomy alive and well    Allergies  Allergen Reactions  . Penicillins     "very high fever"    Current Outpatient Prescriptions on File Prior to Visit  Medication Sig Dispense Refill  . aspirin 81 MG EC tablet Take 1 tablet (81 mg total) by mouth daily. Swallow whole.  30 tablet  12  . B Complex-C (SUPER B COMPLEX) TABS Take 1 tablet by mouth daily.        . benazepril (LOTENSIN) 40 MG tablet TAKE 1 TABLET EVERY DAY  90 tablet  3  . cilostazol (PLETAL) 100 MG tablet TAKE 1/2 TABLET TWICE A DAY FOR 2 WEEKS THEN 1 TABLET TWICE A DAY THEREAFTER  180 tablet  5  . diphenhydramine-acetaminophen (TYLENOL PM) 25-500 MG TABS Take  2 tablets by mouth at bedtime as needed.      . docusate sodium (COLACE) 100 MG capsule Take 100 mg by mouth every morning.      . Glucosamine-Chondroit-Vit C-Mn (GLUCOSAMINE CHONDROITIN COMPLX) TABS Take 1 tablet by mouth daily.        Marland Kitchen ibuprofen (ADVIL,MOTRIN) 200 MG tablet Take 200 mg by mouth every 6 (six) hours as needed. For pain      . Ibuprofen-Diphenhydramine HCl (ADVIL PM) 200-25 MG CAPS Take 2 tablets by mouth at bedtime.       . Multiple Vitamin (MULTIVITAMIN) tablet Take 1 tablet by mouth daily.        . naproxen sodium (ANAPROX) 220 MG tablet Take 440 mg by mouth every morning.      . Potassium Gluconate 550 MG TABS Take 1 tablet by mouth daily.      . simvastatin (ZOCOR) 40 MG tablet TAKE 1 TABLET AT BEDTIME  90 tablet  3   No current facility-administered medications on file prior to visit.    BP 160/70  Pulse 119  Temp(Src) 98.7 F (37.1 C) (Oral)  Resp 20  Wt 171 lb (77.565 kg)  BMI 26.01 kg/m2  SpO2 95%       Review of Systems  Constitutional: Positive for  appetite change and fatigue. Negative for fever and chills.  HENT: Positive for congestion, rhinorrhea and postnasal drip. Negative for hearing loss, ear pain, sore throat, trouble swallowing, neck stiffness, dental problem, voice change and tinnitus.   Eyes: Negative for pain, discharge and visual disturbance.  Respiratory: Positive for cough. Negative for chest tightness, wheezing and stridor.   Cardiovascular: Negative for chest pain, palpitations and leg swelling.  Gastrointestinal: Negative for nausea, vomiting, abdominal pain, diarrhea, constipation, blood in stool and abdominal distention.  Genitourinary: Negative for urgency, hematuria, flank pain, discharge, difficulty urinating and genital sores.  Musculoskeletal: Negative for myalgias, back pain, joint swelling, arthralgias and gait problem.  Skin: Negative for rash.  Neurological: Negative for dizziness, syncope, speech difficulty, weakness, numbness and headaches.  Hematological: Negative for adenopathy. Does not bruise/bleed easily.  Psychiatric/Behavioral: Negative for behavioral problems and dysphoric mood. The patient is not nervous/anxious.        Objective:   Physical Exam  Constitutional: He is oriented to person, place, and time. He appears well-developed.  HENT:  Head: Normocephalic.  Right Ear: External ear normal.  Left Ear: External ear normal.  Eyes: Conjunctivae and EOM are normal.  Neck: Normal range of motion.  Cardiovascular: Normal rate and normal heart sounds.   Pulmonary/Chest: Breath sounds normal.  Abdominal: Bowel sounds are normal.  Musculoskeletal: Normal range of motion. He exhibits no edema and no tenderness.  Neurological: He is alert and oriented to person, place, and time.  Psychiatric: He has a normal mood and affect. His behavior is normal.          Assessment & Plan:   Viral URI with cough. We'll treat symptomatically Hypertension stable Diabetes

## 2012-12-08 NOTE — Patient Instructions (Addendum)
Take over-the-counter expectorants and cough medications such as  Mucinex DM.  Call if there is no improvement in 5 to 7 days or if he developed worsening cough, fever, or new symptoms, such as shortness of breath or chest pain.  Take your antibiotic as prescribed until ALL of it is gone, but stop if you develop a rash, swelling, or any side effects of the medication.  Contact our office as soon as possible if  there are side effects of the medication.   Please check your hemoglobin A1c every 3 months

## 2012-12-08 NOTE — Telephone Encounter (Signed)
PT called and stated that he has a bad head cold and would like to be seen this morning. He stated that he is a personal friend of Dr. Amador Cunas and would like to be seen prior to the next available appt. Please assist.

## 2012-12-22 ENCOUNTER — Ambulatory Visit (INDEPENDENT_AMBULATORY_CARE_PROVIDER_SITE_OTHER): Payer: Medicare Other | Admitting: Internal Medicine

## 2012-12-22 ENCOUNTER — Encounter: Payer: Self-pay | Admitting: Internal Medicine

## 2012-12-22 VITALS — BP 134/68 | HR 77 | Ht 68.0 in | Wt 163.8 lb

## 2012-12-22 DIAGNOSIS — I739 Peripheral vascular disease, unspecified: Secondary | ICD-10-CM

## 2012-12-22 DIAGNOSIS — I10 Essential (primary) hypertension: Secondary | ICD-10-CM

## 2012-12-22 DIAGNOSIS — I70219 Atherosclerosis of native arteries of extremities with intermittent claudication, unspecified extremity: Secondary | ICD-10-CM

## 2012-12-22 DIAGNOSIS — I471 Supraventricular tachycardia: Secondary | ICD-10-CM

## 2012-12-22 DIAGNOSIS — N529 Male erectile dysfunction, unspecified: Secondary | ICD-10-CM

## 2012-12-22 NOTE — Patient Instructions (Addendum)
Your physician recommends that you schedule a follow-up appointment as needed  

## 2012-12-28 DIAGNOSIS — N529 Male erectile dysfunction, unspecified: Secondary | ICD-10-CM | POA: Insufficient documentation

## 2012-12-28 NOTE — Progress Notes (Signed)
PCP: Rogelia Boga, MD Primary Cardiologist:  Dr Excell Seltzer   The patient presents today for followup.  He has had no further arrhythmias since his SVT ablation.  He presents today to discuss erectile dysfunction.  He has apparently struggled with ED for years.  He is followed by urology and has had multiple visits for this.  He has tried viagra, cialis, and levitra without improvement.  He has also tried penile pump which was ineffective.  He is considering prosthetic implant.  He presents today to see if he might have a "heart blockage" as the cause for his ED. He remains very active for his age. He exercises frequently without any ischemic symptoms.   Today, he denies symptoms of palpitations, chest pain, shortness of breath, orthopnea, PND, lower extremity edema, dizziness, presyncope, syncope, or neurologic sequela.  The patient feels that he is tolerating medications without difficulties and is otherwise without complaint today.   Past Medical History  Diagnosis Date  . 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 Hypertension   . Hyperlipidemia   . Chest pain     HX OF  . PVD (peripheral vascular disease)     claudication  . Hx of colonic polyps   . Polymyalgia rheumatica   . BPH (benign prostatic hypertrophy)   . Low back pain   . Pneumonia 2001  . Arthritis   . Diabetes mellitus     no meds and pt denies having diabetes.   Marland Kitchen GERD (gastroesophageal reflux disease)    Past Surgical History  Procedure Laterality Date  . Carpal tunnel release  2010  . Hernia repair      x 2  . Cholecystectomy    . Total hip arthroplasty  2011    complicated by postop ilieus  . Atrial ablation surgery  11/23/10    slow pathway modification for AVNRT by JA  . Total hip arthroplasty  04/08/2012    LEFT  . Total hip arthroplasty  04/07/2012    Procedure: TOTAL HIP ARTHROPLASTY ANTERIOR APPROACH;  Surgeon: Eldred Manges, MD;  Location: [facility] OR;   Service: Orthopedics;  Laterality: Left;  Left Total Hip Arthroplasty, Anterior Approach    Current Outpatient Prescriptions  Medication Sig Dispense Refill  . aspirin 81 MG EC tablet Take 1 tablet (81 mg total) by mouth daily. Swallow whole.  30 tablet  12  . B Complex-C (SUPER B COMPLEX) TABS Take 1 tablet by mouth daily.        . benazepril (LOTENSIN) 40 MG tablet TAKE 1 TABLET EVERY DAY  90 tablet  3  . diphenhydramine-acetaminophen (TYLENOL PM) 25-500 MG TABS Take 2 tablets by mouth at bedtime as needed.      . docusate sodium (COLACE) 100 MG capsule Take 100 mg by mouth every morning.      . Glucosamine-Chondroit-Vit C-Mn (GLUCOSAMINE CHONDROITIN COMPLX) TABS Take 1 tablet by mouth daily.        Marland Kitchen ibuprofen (ADVIL,MOTRIN) 200 MG tablet Take 200 mg by mouth every 6 (six) hours as needed. For pain      . Ibuprofen-Diphenhydramine HCl (ADVIL PM) 200-25 MG CAPS Take 2 tablets by mouth at bedtime.       . Multiple Vitamin (MULTIVITAMIN) tablet Take 1 tablet by mouth daily.        . naproxen sodium (ANAPROX) 220 MG tablet Take 440 mg by mouth every morning.      . Potassium Gluconate 550  MG TABS Take 1 tablet by mouth daily.      . simvastatin (ZOCOR) 40 MG tablet TAKE 1 TABLET AT BEDTIME  90 tablet  3  . azithromycin (ZITHROMAX) 250 MG tablet 2 tablets once daily for 3 consecutive days  6 tablet  0  . cilostazol (PLETAL) 100 MG tablet TAKE 1/2 TABLET TWICE A DAY FOR 2 WEEKS THEN 1 TABLET TWICE A DAY THEREAFTER  180 tablet  5   No current facility-administered medications for this visit.    Allergies  Allergen Reactions  . Penicillins     "very high fever"    History   Social History  . Marital Status: Divorced    Spouse Name: N/A    Number of Children: 2  . Years of Education: N/A   Occupational History  . furniture sales    Social History Main Topics  . Smoking status: Former Smoker    Types: Cigarettes    Quit date: 06/25/1998  . Smokeless tobacco: Never Used  .  Alcohol Use: 3.0 oz/week    6 drink(s) per week     Comment: one 3-4 times per week  . Drug Use: No  . Sexually Active: Not on file   Other Topics Concern  . Not on file   Social History Narrative  . No narrative on file    Family History  Problem Relation Age of Onset  . Other Mother 15    died in a fire  . Heart attack Father 7  . Heart attack Other 6    paternal grandfather  . Other Sister 51    had nephrectomy alive and well   Physical Exam: Filed Vitals:   12/22/12 1427  BP: 134/68  Pulse: 77  Height: 5\' 8"  (1.727 m)  Weight: 163 lb 12.8 oz (74.299 kg)    GEN- The patient is well appearing, alert and oriented x 3 today.   Head- normocephalic, atraumatic Eyes-  Sclera clear, conjunctiva pink Ears- hearing intact Oropharynx- clear Neck- supple, no JVP Lymph- no cervical lymphadenopathy Lungs- Clear to ausculation bilaterally, normal work of breathing Heart- Regular rate and rhythm, no murmurs, rubs or gallops, PMI not laterally displaced GI- soft, NT, ND, + BS Extremities- no clubbing, cyanosis, or edema MS- no significant deformity or atrophy Skin- no rash or lesion Psych- euthymic mood, full affect Neuro- strength and sensation are intact  ekg today reveals sinus rhythm 77 bpm, RBBB  Assessment and Plan:   1. ED- we had a long discussion today about erectile dysfunction.  He has been well treated by his urologist and I have very little to add.  It may be that penile prosthesis is his only option at this point.  I have encouraged him to follow closely with his urologist. He is not on medicine that would contribute to ED.  He has no ischemic symptoms to suggest CAD as a cause.  He has vascular disease for which he has previously seen Dr Excell Seltzer.  He feels that this is presently stable. No further CAD workup is planned.  2. SVT Resolved s/p ablation  3. PVD Stable He will followup with Dr Excell Seltzer  4. HTN Stable No change required today   No  further EP management is planned. I will see on an as needed basis.

## 2012-12-29 ENCOUNTER — Telehealth: Payer: Self-pay | Admitting: Internal Medicine

## 2012-12-29 ENCOUNTER — Encounter: Payer: Self-pay | Admitting: Internal Medicine

## 2012-12-29 ENCOUNTER — Ambulatory Visit (INDEPENDENT_AMBULATORY_CARE_PROVIDER_SITE_OTHER): Payer: Medicare Other | Admitting: Internal Medicine

## 2012-12-29 VITALS — BP 132/60 | HR 72 | Temp 97.8°F | Resp 20 | Wt 164.0 lb

## 2012-12-29 DIAGNOSIS — I10 Essential (primary) hypertension: Secondary | ICD-10-CM

## 2012-12-29 DIAGNOSIS — E119 Type 2 diabetes mellitus without complications: Secondary | ICD-10-CM

## 2012-12-29 DIAGNOSIS — M353 Polymyalgia rheumatica: Secondary | ICD-10-CM

## 2012-12-29 DIAGNOSIS — R109 Unspecified abdominal pain: Secondary | ICD-10-CM

## 2012-12-29 DIAGNOSIS — R634 Abnormal weight loss: Secondary | ICD-10-CM

## 2012-12-29 LAB — COMPREHENSIVE METABOLIC PANEL WITH GFR
ALT: 15 U/L (ref 0–53)
AST: 18 U/L (ref 0–37)
Albumin: 3.7 g/dL (ref 3.5–5.2)
Alkaline Phosphatase: 60 U/L (ref 39–117)
BUN: 30 mg/dL — ABNORMAL HIGH (ref 6–23)
CO2: 27 meq/L (ref 19–32)
Calcium: 9.4 mg/dL (ref 8.4–10.5)
Chloride: 106 meq/L (ref 96–112)
Creatinine, Ser: 1.1 mg/dL (ref 0.4–1.5)
GFR: 71.75 mL/min
Glucose, Bld: 103 mg/dL — ABNORMAL HIGH (ref 70–99)
Potassium: 4.7 meq/L (ref 3.5–5.1)
Sodium: 137 meq/L (ref 135–145)
Total Bilirubin: 1.2 mg/dL (ref 0.3–1.2)
Total Protein: 7.2 g/dL (ref 6.0–8.3)

## 2012-12-29 LAB — CBC WITH DIFFERENTIAL/PLATELET
Basophils Absolute: 0.1 10*3/uL (ref 0.0–0.1)
Eosinophils Absolute: 0.8 10*3/uL — ABNORMAL HIGH (ref 0.0–0.7)
HCT: 37 % — ABNORMAL LOW (ref 39.0–52.0)
Lymphs Abs: 1.9 10*3/uL (ref 0.7–4.0)
MCV: 94.1 fl (ref 78.0–100.0)
Monocytes Absolute: 0.9 10*3/uL (ref 0.1–1.0)
Neutrophils Relative %: 64 % (ref 43.0–77.0)
Platelets: 337 10*3/uL (ref 150.0–400.0)
RDW: 14.5 % (ref 11.5–14.6)

## 2012-12-29 LAB — SEDIMENTATION RATE: Sed Rate: 58 mm/h — ABNORMAL HIGH (ref 0–22)

## 2012-12-29 LAB — TSH: TSH: 0.05 u[IU]/mL — ABNORMAL LOW (ref 0.35–5.50)

## 2012-12-29 NOTE — Patient Instructions (Signed)
Align one daily  Return in one week for followup  CT abdominal scan as discussed

## 2012-12-29 NOTE — Progress Notes (Signed)
Subjective:    Patient ID: Jorge Smith, male    DOB: 03/24/1933, 79 y.o.   MRN: [id number]  HPI  Wt Readings from Last 3 Encounters:  12/29/12 164 lb (74.39 kg)  12/22/12 163 lb 12.8 oz (74.299 kg)  12/08/12 171 lb (77.57 kg)   79 year old patient who presents with an 8-week history of intermittent diarrhea.  He also describes intermittent constipation. Over the past week he has developed the abdominal pain. Approximately 2 weeks ago he was treated with azithromycin for an upper Speicher tract infection. He has been using intermittent Imodium. He states that over the past several months he has had a 20 pound weight loss. He generally feels well although his appetite may be slightly diminished. He played golf 3 times last week. He had to leave work once last week do to worsening abdominal pain.  Past Medical History  Diagnosis Date  . 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 Hypertension   . Hyperlipidemia   . Chest pain     HX OF  . PVD (peripheral vascular disease)     claudication  . Hx of colonic polyps   . Polymyalgia rheumatica   . BPH (benign prostatic hypertrophy)   . Low back pain   . Pneumonia 2001  . Arthritis   . Diabetes mellitus     no meds and pt denies having diabetes.   Marland Kitchen GERD (gastroesophageal reflux disease)     History   Social History  . Marital Status: Divorced    Spouse Name: N/A    Number of Children: 2  . Years of Education: N/A   Occupational History  . furniture sales    Social History Main Topics  . Smoking status: Former Smoker    Types: Cigarettes    Quit date: 06/25/1998  . Smokeless tobacco: Never Used  . Alcohol Use: 3.0 oz/week    6 drink(s) per week     Comment: one 3-4 times per week  . Drug Use: No  . Sexually Active: Not on file   Other Topics Concern  . Not on file   Social History Narrative  . No narrative on file    Past Surgical History  Procedure Laterality Date   . Carpal tunnel release  2010  . Hernia repair      x 2  . Cholecystectomy    . Total hip arthroplasty  2011    complicated by postop ilieus  . Atrial ablation surgery  11/23/10    slow pathway modification for AVNRT by JA  . Total hip arthroplasty  04/08/2012    LEFT  . Total hip arthroplasty  04/07/2012    Procedure: TOTAL HIP ARTHROPLASTY ANTERIOR APPROACH;  Surgeon: Eldred Manges, MD;  Location: [facility] OR;  Service: Orthopedics;  Laterality: Left;  Left Total Hip Arthroplasty, Anterior Approach    Family History  Problem Relation Age of Onset  . Other Mother 64    died in a fire  . Heart attack Father 27  . Heart attack Other 86    paternal grandfather  . Other Sister 32    had nephrectomy alive and well    Allergies  Allergen Reactions  . Penicillins     "very high fever"    Current Outpatient Prescriptions on File Prior to Visit  Medication Sig Dispense Refill  . aspirin 81 MG EC tablet Take 1 tablet (81 mg total) by mouth daily. Swallow  whole.  30 tablet  12  . B Complex-C (SUPER B COMPLEX) TABS Take 1 tablet by mouth daily.        . benazepril (LOTENSIN) 40 MG tablet TAKE 1 TABLET EVERY DAY  90 tablet  3  . diphenhydramine-acetaminophen (TYLENOL PM) 25-500 MG TABS Take 2 tablets by mouth at bedtime as needed.      . docusate sodium (COLACE) 100 MG capsule Take 100 mg by mouth every morning.      . Glucosamine-Chondroit-Vit C-Mn (GLUCOSAMINE CHONDROITIN COMPLX) TABS Take 1 tablet by mouth daily.        Marland Kitchen ibuprofen (ADVIL,MOTRIN) 200 MG tablet Take 200 mg by mouth every 6 (six) hours as needed. For pain      . Ibuprofen-Diphenhydramine HCl (ADVIL PM) 200-25 MG CAPS Take 2 tablets by mouth at bedtime.       . Multiple Vitamin (MULTIVITAMIN) tablet Take 1 tablet by mouth daily.        . naproxen sodium (ANAPROX) 220 MG tablet Take 440 mg by mouth every morning.      . Potassium Gluconate 550 MG TABS Take 1 tablet by mouth daily.      . simvastatin (ZOCOR) 40 MG tablet TAKE  1 TABLET AT BEDTIME  90 tablet  3   No current facility-administered medications on file prior to visit.    BP 132/60  Pulse 72  Temp(Src) 97.8 F (36.6 C) (Oral)  Resp 20  Wt 164 lb (74.39 kg)  BMI 24.94 kg/m2  SpO2 98%      Review of Systems  Constitutional: Positive for appetite change, fatigue and unexpected weight change. Negative for fever and chills.  HENT: Negative for hearing loss, ear pain, congestion, sore throat, trouble swallowing, neck stiffness, dental problem, voice change and tinnitus.   Eyes: Negative for pain, discharge and visual disturbance.  Respiratory: Negative for cough, chest tightness, wheezing and stridor.   Cardiovascular: Negative for chest pain, palpitations and leg swelling.  Gastrointestinal: Positive for abdominal pain, diarrhea and constipation. Negative for nausea, vomiting, blood in stool and abdominal distention.  Genitourinary: Negative for urgency, hematuria, flank pain, discharge, difficulty urinating and genital sores.  Musculoskeletal: Negative for myalgias, back pain, joint swelling, arthralgias and gait problem.  Skin: Negative for rash.  Neurological: Negative for dizziness, syncope, speech difficulty, weakness, numbness and headaches.  Hematological: Negative for adenopathy. Does not bruise/bleed easily.  Psychiatric/Behavioral: Negative for behavioral problems and dysphoric mood. The patient is not nervous/anxious.        Objective:   Physical Exam  Constitutional: He is oriented to person, place, and time. He appears well-developed.  HENT:  Head: Normocephalic.  Right Ear: External ear normal.  Left Ear: External ear normal.  Eyes: Conjunctivae and EOM are normal.  Neck: Normal range of motion.  Cardiovascular: Normal rate and normal heart sounds.   Pulmonary/Chest: Breath sounds normal.  Abdominal: Soft. Bowel sounds are normal. He exhibits no distension and no mass. There is no tenderness. There is no rebound and no  guarding.  Musculoskeletal: Normal range of motion. He exhibits no edema and no tenderness.  Neurological: He is alert and oriented to person, place, and time.  Psychiatric: He has a normal mood and affect. His behavior is normal.          Assessment & Plan:   Abdominal pain with weight loss Diarrhea Diet controlled diabetes Hypertension  We'll check screening lab and set up for an abdominal CT scan We'll check a hemoglobin A1c Recheck  1 week We'll treat with a probiotic for 2 weeks

## 2012-12-29 NOTE — Telephone Encounter (Signed)
[facility] Triage Call Report Triage Record Num: [id number] Operator: Chevis Pretty Patient Name: Jorge Smith Call Date & Time: 12/27/2012 12:49:59PM Patient Phone: [phone] PCP: Gordy Savers Patient Gender: Male PCP Fax : [phone] Patient DOB: 24-Jun-1933 [facility] Name[facility] Reason for Call: Caller: [name]; PCP: Eleonore Chiquito (Family Practice); CB#: [phone]; Call regarding Diarrhea x 1 month, with weight loss. States seen in office 10 days ago and finished zpack. Has cramps, and states has been told by friends it might be diverticulitis. States immodium has been helpful. Has some bloating noted. Per diarrhea protocol, advised being seen within 24 hours; patient prefers to be seen Monday 12/29/12 by Dr. Amador Cunas. Per patient request, appt scheduled 12/29/12 0915 with Dr. Amador Cunas. Protocol(s) Used: Diarrhea or Other Change in Bowel Habits Recommended Outcome per Protocol: See Provider within 24 hours Reason for Outcome: Evaluated by provider AND symptoms not improving or worsening with suggested treatment or home care Care Advice: Do not take aspirin, ibuprofen, ketoprofen, naproxen, etc., or other pain relieving medications until consulting with provider. ~ ~ Call provider if symptoms worsen, such as high fever, dizziness, severe abdominal cramping, or confusion or lethargy. ~ SYMPTOM / CONDITION MANAGEMENT ~ CAUTIONS Diarrheal Care: - Drink 2-3 quarts (2-3 liters) per day of low sugar content fluids, including over the counter oral hydration solution, unless directed otherwise by provider. - If accompanied by vomiting, take the fluids in frequent small sips or suck on ice chips. - Eat easily digested foods (such as bananas, rice, applesauce, toast, cooked cereals, soup, crackers, baked or boiled potato, or baked chicken or [place] without skin). - Do not eat high fiber, high fat, high sugar content foods, or highly  seasoned foods. - Do not drink caffeinated or alcoholic beverages. - Avoid milk and milk products while having symptoms. As symptoms improve, gradually add back to diet. - Application of A&D ointment or witch hazel medicated pads may help anal irritation. - Antidiarrheal medications are usually unnecessary. If symptoms are severe, consider nonprescription antidiarrheal and anti-motility drugs as directed by label or a provider. Do not take if have high fever or bloody diarrhea. If pregnant, do not take any medications not approved by your provider. - Consult your provider for advice regarding continuing prescription medication. ~ Go to the ED if you have developed signs and symptoms of dehydration such as very dry mouth and tongue; increased pulse rate at rest; no urine output for 12 hours or more; increasing weakness or drowsiness, or lightheadedness when trying to sit upright or standing. ~ 12/27/2012 12:59:28PM Page 1 of 1 [id number]

## 2012-12-30 ENCOUNTER — Other Ambulatory Visit: Payer: Self-pay | Admitting: Internal Medicine

## 2012-12-30 DIAGNOSIS — E0590 Thyrotoxicosis, unspecified without thyrotoxic crisis or storm: Secondary | ICD-10-CM

## 2013-01-01 ENCOUNTER — Encounter: Payer: Self-pay | Admitting: Internal Medicine

## 2013-01-07 ENCOUNTER — Other Ambulatory Visit: Payer: Self-pay | Admitting: Internal Medicine

## 2013-01-07 ENCOUNTER — Other Ambulatory Visit (INDEPENDENT_AMBULATORY_CARE_PROVIDER_SITE_OTHER): Payer: Medicare Other

## 2013-01-07 DIAGNOSIS — E039 Hypothyroidism, unspecified: Secondary | ICD-10-CM

## 2013-01-07 LAB — T4, FREE: Free T4: 1.57 ng/dL (ref 0.60–1.60)

## 2013-01-08 ENCOUNTER — Encounter (HOSPITAL_COMMUNITY)
Admission: RE | Admit: 2013-01-08 | Discharge: 2013-01-08 | Disposition: A | Discharge status: Home / Self Care | Payer: Medicare Other | Source: Ambulatory Visit | Attending: Internal Medicine | Admitting: Internal Medicine

## 2013-01-08 ENCOUNTER — Telehealth: Payer: Self-pay | Admitting: Internal Medicine

## 2013-01-08 DIAGNOSIS — E0590 Thyrotoxicosis, unspecified without thyrotoxic crisis or storm: Secondary | ICD-10-CM

## 2013-01-08 NOTE — Telephone Encounter (Signed)
Pt stated MD called him and he is returning the call. Please call pt at work # and ask for [facility]

## 2013-01-09 ENCOUNTER — Encounter (HOSPITAL_COMMUNITY)
Admission: RE | Admit: 2013-01-09 | Discharge: 2013-01-09 | Disposition: A | Discharge status: Home / Self Care | Payer: Medicare Other | Source: Ambulatory Visit | Attending: Internal Medicine | Admitting: Internal Medicine

## 2013-01-09 MED ORDER — SODIUM IODIDE I 131 CAPSULE
14.5000 | Freq: Once | INTRAVENOUS | Status: AC | PRN
  Administered 2013-01-09: 14.5 via ORAL

## 2013-01-09 MED ORDER — SODIUM PERTECHNETATE TC 99M INJECTION
10.4000 | Freq: Once | INTRAVENOUS | Status: AC | PRN
  Administered 2013-01-09: 10.4 via INTRAVENOUS

## 2013-01-12 NOTE — Telephone Encounter (Signed)
Please schedule return office visit in one month

## 2013-01-15 NOTE — Telephone Encounter (Signed)
Pt will callback when he gets his work schedule

## 2013-01-28 ENCOUNTER — Other Ambulatory Visit: Payer: Self-pay

## 2013-05-30 ENCOUNTER — Other Ambulatory Visit: Payer: Self-pay | Admitting: Internal Medicine

## 2013-11-05 ENCOUNTER — Ambulatory Visit (INDEPENDENT_AMBULATORY_CARE_PROVIDER_SITE_OTHER): Payer: Medicare Other | Admitting: Internal Medicine

## 2013-11-05 ENCOUNTER — Encounter: Payer: Self-pay | Admitting: Internal Medicine

## 2013-11-05 VITALS — BP 150/80 | HR 64 | Temp 98.0°F | Resp 20 | Ht 67.75 in | Wt 180.0 lb

## 2013-11-05 DIAGNOSIS — R946 Abnormal results of thyroid function studies: Secondary | ICD-10-CM

## 2013-11-05 DIAGNOSIS — M353 Polymyalgia rheumatica: Secondary | ICD-10-CM

## 2013-11-05 DIAGNOSIS — I10 Essential (primary) hypertension: Secondary | ICD-10-CM

## 2013-11-05 DIAGNOSIS — E119 Type 2 diabetes mellitus without complications: Secondary | ICD-10-CM

## 2013-11-05 DIAGNOSIS — I70219 Atherosclerosis of native arteries of extremities with intermittent claudication, unspecified extremity: Secondary | ICD-10-CM

## 2013-11-05 DIAGNOSIS — Z0000 Encounter for general adult medical examination without abnormal findings: Secondary | ICD-10-CM

## 2013-11-05 DIAGNOSIS — E785 Hyperlipidemia, unspecified: Secondary | ICD-10-CM

## 2013-11-05 DIAGNOSIS — I739 Peripheral vascular disease, unspecified: Secondary | ICD-10-CM

## 2013-11-05 DIAGNOSIS — R7989 Other specified abnormal findings of blood chemistry: Secondary | ICD-10-CM

## 2013-11-05 LAB — CBC WITH DIFFERENTIAL/PLATELET
BASOS ABS: 0 10*3/uL (ref 0.0–0.1)
Basophils Relative: 0.4 % (ref 0.0–3.0)
Eosinophils Absolute: 0.9 10*3/uL — ABNORMAL HIGH (ref 0.0–0.7)
Eosinophils Relative: 9.4 % — ABNORMAL HIGH (ref 0.0–5.0)
HCT: 42.4 % (ref 39.0–52.0)
Hemoglobin: 14.1 g/dL (ref 13.0–17.0)
LYMPHS PCT: 17.1 % (ref 12.0–46.0)
Lymphs Abs: 1.6 10*3/uL (ref 0.7–4.0)
MCHC: 33.2 g/dL (ref 30.0–36.0)
MCV: 96.6 fl (ref 78.0–100.0)
Monocytes Absolute: 0.7 10*3/uL (ref 0.1–1.0)
Monocytes Relative: 7.2 % (ref 3.0–12.0)
Neutro Abs: 6.1 10*3/uL (ref 1.4–7.7)
Neutrophils Relative %: 65.9 % (ref 43.0–77.0)
PLATELETS: 264 10*3/uL (ref 150.0–400.0)
RBC: 4.39 Mil/uL (ref 4.22–5.81)
RDW: 15.2 % (ref 11.5–15.5)
WBC: 9.3 10*3/uL (ref 4.0–10.5)

## 2013-11-05 LAB — TSH: TSH: 0.81 u[IU]/mL (ref 0.35–4.50)

## 2013-11-05 LAB — COMPREHENSIVE METABOLIC PANEL
ALT: 21 U/L (ref 0–53)
AST: 24 U/L (ref 0–37)
Albumin: 4 g/dL (ref 3.5–5.2)
Alkaline Phosphatase: 66 U/L (ref 39–117)
BUN: 20 mg/dL (ref 6–23)
CALCIUM: 9.4 mg/dL (ref 8.4–10.5)
CO2: 26 meq/L (ref 19–32)
Chloride: 109 mEq/L (ref 96–112)
Creatinine, Ser: 1.1 mg/dL (ref 0.4–1.5)
GFR: 72.38 mL/min (ref >60.00)
GLUCOSE: 104 mg/dL — AB (ref 70–99)
POTASSIUM: 4 meq/L (ref 3.5–5.1)
SODIUM: 140 meq/L (ref 135–145)
TOTAL PROTEIN: 7 g/dL (ref 6.0–8.3)
Total Bilirubin: 1.3 mg/dL — ABNORMAL HIGH (ref 0.2–1.2)

## 2013-11-05 LAB — LIPID PANEL
CHOLESTEROL: 158 mg/dL (ref 0–200)
HDL: 42.4 mg/dL (ref >39.00)
LDL Cholesterol: 96 mg/dL (ref 0–99)
TRIGLYCERIDES: 98 mg/dL (ref 0.0–149.0)
Total CHOL/HDL Ratio: 4
VLDL: 19.6 mg/dL (ref 0.0–40.0)

## 2013-11-05 LAB — HEMOGLOBIN A1C: HEMOGLOBIN A1C: 6.5 % (ref 4.6–6.5)

## 2013-11-05 LAB — SEDIMENTATION RATE: SED RATE: 17 mm/h (ref 0–22)

## 2013-11-05 MED ORDER — BENAZEPRIL HCL 40 MG PO TABS: ORAL_TABLET | ORAL | Status: DC

## 2013-11-05 MED ORDER — SIMVASTATIN 40 MG PO TABS: ORAL_TABLET | ORAL | Status: AC

## 2013-11-05 NOTE — Progress Notes (Signed)
Patient ID: Jorge HerbLawrence H Smith, male   DOB: 08-Oct-1932, 80 y.o.   MRN: [id number]  Subjective:    Patient ID: Jorge Smith, male    DOB: 08-Oct-1932, 80 y.o.   MRN: [id number]  HPI   CC: cpx - doing well and labs done.  History of Present Illness:   80  year-old patient seen today for a comprehensive evaluation.  Medical problems include BPH, history of polymyalgia rheumatica, impaired glucose tolerance, hypertension, and dyslipidemia. He has a history PA D., which has been stable. He has osteoarthritis and is status post bilateral hip replacement. He is followed by a number of consultants.  He is followed by cardiology and is status post ablation. He was seen in July of last year with thyroiditis with weight loss and a suppressed TSH.  Thyroid scan revealed a very low uptake.  More recently, he has had some weight gain.  Complaints today include some discomfort involving his plantar aspect of the left foot.  He also describes some shortness of breath when he bends over at the waist.  It  Past Medical History  Diagnosis Date  . 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 Hypertension   . Hyperlipidemia   . Chest pain     HX OF  . PVD (peripheral vascular disease)     claudication  . Hx of colonic polyps   . Polymyalgia rheumatica   . BPH (benign prostatic hypertrophy)   . Low back pain   . Pneumonia 2001  . Arthritis   . Diabetes mellitus     no meds and pt denies having diabetes.   Marland Kitchen. GERD (gastroesophageal reflux disease)     History   Social History  . Marital Status: Divorced    Spouse Name: N/A    Number of Children: 2  . Years of Education: N/A   Occupational History  . furniture sales    Social History Main Topics  . Smoking status: Former Smoker    Types: Cigarettes    Quit date: 06/25/1998  . Smokeless tobacco: Never Used  . Alcohol Use: 3.0 oz/week    6 drink(s) per week     Comment: one 3-4 times per week  .  Drug Use: No  . Sexual Activity: Not on file   Other Topics Concern  . Not on file   Social History Narrative  . No narrative on file    Past Surgical History  Procedure Laterality Date  . Carpal tunnel release  2010  . Hernia repair      x 2  . Cholecystectomy    . Total hip arthroplasty  2011    complicated by postop ilieus  . Atrial ablation surgery  11/23/10    slow pathway modification for AVNRT by JA  . Total hip arthroplasty  04/08/2012    LEFT  . Total hip arthroplasty  04/07/2012    Procedure: TOTAL HIP ARTHROPLASTY ANTERIOR APPROACH;  Surgeon: Jorge MangesMark C Yates, MD;  Location: [facility] OR;  Service: Orthopedics;  Laterality: Left;  Left Total Hip Arthroplasty, Anterior Approach    Family History  Problem Relation Age of Onset  . Other Mother 4084    died in a fire  . Heart attack Father 1662  . Heart attack Other 5248    paternal grandfather  . Other Sister 3381    had nephrectomy alive and well    Allergies  Allergen Reactions  . Penicillins     "  very high fever"    Current Outpatient Prescriptions on File Prior to Visit  Medication Sig Dispense Refill  . aspirin 81 MG EC tablet Take 1 tablet (81 mg total) by mouth daily. Swallow whole.  30 tablet  12  . B Complex-C (SUPER B COMPLEX) TABS Take 1 tablet by mouth daily.        . diphenhydramine-acetaminophen (TYLENOL PM) 25-500 MG TABS Take 2 tablets by mouth at bedtime as needed.      . docusate sodium (COLACE) 100 MG capsule Take 100 mg by mouth every morning.      . Glucosamine-Chondroit-Vit C-Mn (GLUCOSAMINE CHONDROITIN COMPLX) TABS Take 1 tablet by mouth daily.        Marland Kitchen ibuprofen (ADVIL,MOTRIN) 200 MG tablet Take 200 mg by mouth every 6 (six) hours as needed. For pain      . Ibuprofen-Diphenhydramine HCl (ADVIL PM) 200-25 MG CAPS Take 2 tablets by mouth at bedtime.       . Multiple Vitamin (MULTIVITAMIN) tablet Take 1 tablet by mouth daily.        . naproxen sodium (ANAPROX) 220 MG tablet Take 440 mg by mouth every  morning.      . Potassium Gluconate 550 MG TABS Take 1 tablet by mouth daily.       No current facility-administered medications on file prior to visit.    BP 150/80  Pulse 64  Temp(Src) 98 F (36.7 C) (Oral)  Resp 20  Ht 5' 7.75" (1.721 m)  Wt 180 lb (81.647 kg)  BMI 27.57 kg/m2  SpO2 98%      1. Risk factors, based on past  M,S,F history-  cardiovascular risk factors include hypertension dyslipidemia and impaired glucose tolerance  2.  Physical activities: Remains quite active but limited somewhat due to her left hip pain and mild claudication. He continues to work a 40 hour work week  3.  Depression/mood: History depression or mood disorder  4.  Hearing: No major deficits  5.  ADL's: Independent in all aspects of daily living  6.  Fall risk: Low  7.  Home safety: No problems identified  8.  Height weight, and visual acuity; height and weight stable uses soft contacts  9.  Counseling: regular exercise heart healthy diet all encouraged  10. Lab orders based on risk factors: Laboratory profile discussed  11. Referral : We'll continue to follow with cardiology ophthalmology urology and orthopedics  12. Care plan: Continue present regimen  13. Cognitive assessment: Alert and oriented with normal affect no cognitive dysfunction     Allergies:  1) ! Penicillin   Past History:  Past Medical History:   E.D.  GERD  Hyperlipidemia  arrythmia 2006  Low back pain  lumbar disc disease (Jorge Smith)  hx of alcohol abuse  Peripheral vascular disease  Diabetes mellitus, type II - diet  Hypertension  Benign prostatic hypertrophy ( Jorge Smith)  PMR (Jorge Smith)  carpal tunnel syndrome  Colonic polyps, hx of  RBBB   Past Surgical History:  hernia x 2  Cholecystectomy  Carpal tunnel (Bilateral) 2010  colonoscopy 2007   Family History:  Reviewed history from 08/29/2008 and no changes required.  Father - MI in his 53's  No PAD or Stroke in the family   Social  History:  Reviewed history from 07/05/2008 and no changes required.   Former Smoker discontinued and 10 years ago  Alcohol use-yes  Married but lives apart- third wife  6 children- ages 72 through 29  work -Works  part-time at golf, Jorge Smith has lived in [place] for 25 years    Review of Systems  Constitutional: Negative for fever, chills, activity change, appetite change and fatigue.  HENT: Negative for congestion, dental problem, ear pain, hearing loss, mouth sores, rhinorrhea, sinus pressure, sneezing, tinnitus, trouble swallowing and voice change.   Eyes: Negative for photophobia, pain, redness and visual disturbance.  Respiratory: Negative for apnea, cough, choking, chest tightness, shortness of breath and wheezing.   Cardiovascular: Negative for chest pain, palpitations and leg swelling.  Gastrointestinal: Negative for nausea, vomiting, abdominal pain, diarrhea, constipation, blood in stool, abdominal distention, anal bleeding and rectal pain.  Genitourinary: Negative for dysuria, urgency, frequency, hematuria, flank pain, decreased urine volume, discharge, penile swelling, scrotal swelling, difficulty urinating, genital sores and testicular pain.  Musculoskeletal: Positive for gait problem (mild claudication and left hip pain). Negative for arthralgias, back pain, joint swelling, myalgias, neck pain and neck stiffness.  Skin: Negative for color change, rash and wound.  Neurological: Negative for dizziness, tremors, seizures, syncope, facial asymmetry, speech difficulty, weakness, light-headedness, numbness and headaches.  Hematological: Negative for adenopathy. Does not bruise/bleed easily.  Psychiatric/Behavioral: Negative for suicidal ideas, hallucinations, behavioral problems, confusion, sleep disturbance, self-injury, dysphoric mood, decreased concentration and agitation. The patient is not nervous/anxious.        Objective:   Physical Exam  Constitutional: He appears well-developed  and well-nourished.  HENT:  Head: Normocephalic and atraumatic.  Right Ear: External ear normal.  Left Ear: External ear normal.  Nose: Nose normal.  Mouth/Throat: Oropharynx is clear and moist.  Eyes: Conjunctivae and EOM are normal. Pupils are equal, round, and reactive to light. No scleral icterus.  Neck: Normal range of motion. Neck supple. No JVD present. No thyromegaly present.  Cardiovascular: Regular rhythm, normal heart sounds and intact distal pulses.  Exam reveals no gallop and no friction rub.   No murmur heard. Posterior tibial pulses are full. Dorsalis pedis pulses not easily palpable  Pulmonary/Chest: Effort normal. He has rales. He exhibits no tenderness.  A few bibasilar crackles  Abdominal: Soft. Bowel sounds are normal. He exhibits no distension and no mass. There is no tenderness.  Genitourinary: Prostate normal and penis normal. Guaiac negative stool.  Prostate enlarged with a more prominent left lobe  Musculoskeletal: Normal range of motion. He exhibits no edema and no tenderness.  Lymphadenopathy:    He has no cervical adenopathy.  Neurological: He is alert. He has normal reflexes. No cranial nerve deficit. Coordination normal.  Skin: Skin is warm and dry. No rash noted.  Psychiatric: He has a normal mood and affect. His behavior is normal.          Assessment & Plan:    Preventive health examination  Impaired glucose tolerance  we'll check a hemoglobin A1c BPH with ED Hypertension controlled History of thyroiditis.  We'll check a TSH History of PMR.  We'll check a sedimentation rate  We'll continue present regimen Medicines refilled Recheck in one year or as needed

## 2013-11-05 NOTE — Patient Instructions (Signed)
Limit your sodium (Salt) intake    It is important that you exercise regularly, at least 20 minutes 3 to 4 times per week.  If you develop chest pain or shortness of breath seek  medical attention.  Return in 6 months for follow-up  

## 2013-11-05 NOTE — Progress Notes (Signed)
Pre-visit discussion using our clinic review tool. No additional management support is needed unless otherwise documented below in the visit note.  

## 2013-11-06 ENCOUNTER — Telehealth: Payer: Self-pay | Admitting: Internal Medicine

## 2013-11-06 NOTE — Telephone Encounter (Signed)
Relevant patient education assigned to patient using Emmi. ° °

## 2013-11-11 ENCOUNTER — Encounter: Payer: Self-pay | Admitting: Internal Medicine

## 2013-11-30 ENCOUNTER — Other Ambulatory Visit: Payer: Self-pay | Admitting: Internal Medicine

## 2013-12-07 ENCOUNTER — Telehealth: Payer: Self-pay | Admitting: Internal Medicine

## 2013-12-07 MED ORDER — PRAVASTATIN SODIUM 20 MG PO TABS: 20.0000 mg | ORAL_TABLET | Freq: Every day | ORAL | Status: DC

## 2013-12-07 MED ORDER — BENAZEPRIL HCL 40 MG PO TABS: ORAL_TABLET | ORAL | Status: DC

## 2013-12-07 NOTE — Telephone Encounter (Signed)
Pt states that the rx simvastatin (ZOCOR) 40 MG tablet gives him bad cramps in his legs and severe leg pain in both legs. Pt wants to know if he can try something else. States he only has 2 pills left and he is suppose to pick up the refills today, however he wants to know if there is something else he can try. Send to [facility]- [address]. Pt would like call back to let him know.

## 2013-12-07 NOTE — Telephone Encounter (Signed)
Trial of  Pravastatin  20 mg daily  , #60, one daily

## 2013-12-07 NOTE — Telephone Encounter (Signed)
Rx sent and patient is aware. 

## 2013-12-28 ENCOUNTER — Encounter: Payer: Self-pay | Admitting: Internal Medicine

## 2014-01-28 ENCOUNTER — Telehealth: Payer: Self-pay | Admitting: Internal Medicine

## 2014-01-28 MED ORDER — PRAVASTATIN SODIUM 20 MG PO TABS: 20.0000 mg | ORAL_TABLET | Freq: Every day | ORAL | Status: DC

## 2014-01-28 NOTE — Telephone Encounter (Signed)
Spoke to pt told him can send a 90 day supply but not 120 day supply or I can send 30 day supply and let me know when out and then do 90 day with refills. Pt said do 30 for now and then I will call back to refill for 90 day supply. Told pt okay, Rx sent.

## 2014-01-28 NOTE — Telephone Encounter (Signed)
Pt request refill pravastatin (PRAVACHOL) 20 MG tablet But pt would like a 4 mo supply. Then after the 4 mo, make it a 3 mo supply.  The last rx was sent in for 2 mos only and it got him off schedule for the rest of his meds.(which  Pt wants dr Kirtland Bouchardk to know yesterday, 8/5, pt shot his age: [phone].

## 2014-02-25 ENCOUNTER — Telehealth: Payer: Self-pay | Admitting: Internal Medicine

## 2014-02-25 MED ORDER — PRAVASTATIN SODIUM 20 MG PO TABS: 20.0000 mg | ORAL_TABLET | Freq: Every day | ORAL | Status: DC

## 2014-02-25 NOTE — Telephone Encounter (Signed)
Pt notified Rx sent to pharmacy

## 2014-02-25 NOTE — Telephone Encounter (Signed)
Pt request refill pravastatin (PRAVACHOL) 20 MG tablet  90 day w/ refill [facility] [address]

## 2014-08-25 ENCOUNTER — Other Ambulatory Visit: Payer: Self-pay | Admitting: Internal Medicine

## 2014-09-27 ENCOUNTER — Other Ambulatory Visit: Payer: Self-pay | Admitting: *Deleted

## 2014-09-27 MED ORDER — BENAZEPRIL HCL 40 MG PO TABS: 40.0000 mg | ORAL_TABLET | Freq: Every day | ORAL | Status: AC

## 2014-11-02 DIAGNOSIS — E785 Hyperlipidemia, unspecified: Secondary | ICD-10-CM | POA: Diagnosis not present

## 2014-11-02 DIAGNOSIS — R799 Abnormal finding of blood chemistry, unspecified: Secondary | ICD-10-CM | POA: Diagnosis not present

## 2014-11-02 DIAGNOSIS — I739 Peripheral vascular disease, unspecified: Secondary | ICD-10-CM | POA: Diagnosis not present

## 2014-11-02 DIAGNOSIS — M1990 Unspecified osteoarthritis, unspecified site: Secondary | ICD-10-CM | POA: Diagnosis not present

## 2014-11-02 DIAGNOSIS — I10 Essential (primary) hypertension: Secondary | ICD-10-CM | POA: Diagnosis not present

## 2014-11-08 DIAGNOSIS — E785 Hyperlipidemia, unspecified: Secondary | ICD-10-CM | POA: Diagnosis not present

## 2014-11-08 DIAGNOSIS — N529 Male erectile dysfunction, unspecified: Secondary | ICD-10-CM | POA: Diagnosis not present

## 2014-11-08 DIAGNOSIS — I10 Essential (primary) hypertension: Secondary | ICD-10-CM | POA: Diagnosis not present

## 2014-11-08 DIAGNOSIS — I739 Peripheral vascular disease, unspecified: Secondary | ICD-10-CM | POA: Diagnosis not present

## 2014-11-18 DIAGNOSIS — I209 Angina pectoris, unspecified: Secondary | ICD-10-CM | POA: Diagnosis not present

## 2014-11-18 DIAGNOSIS — R9431 Abnormal electrocardiogram [ECG] [EKG]: Secondary | ICD-10-CM | POA: Diagnosis not present

## 2014-11-18 DIAGNOSIS — I2510 Atherosclerotic heart disease of native coronary artery without angina pectoris: Secondary | ICD-10-CM | POA: Diagnosis not present

## 2014-11-24 ENCOUNTER — Other Ambulatory Visit: Payer: Self-pay | Admitting: Internal Medicine

## 2014-12-02 DIAGNOSIS — E785 Hyperlipidemia, unspecified: Secondary | ICD-10-CM | POA: Diagnosis not present

## 2014-12-02 DIAGNOSIS — M1990 Unspecified osteoarthritis, unspecified site: Secondary | ICD-10-CM | POA: Diagnosis not present

## 2014-12-02 DIAGNOSIS — I10 Essential (primary) hypertension: Secondary | ICD-10-CM | POA: Diagnosis not present

## 2014-12-02 DIAGNOSIS — I739 Peripheral vascular disease, unspecified: Secondary | ICD-10-CM | POA: Diagnosis not present

## 2014-12-02 DIAGNOSIS — N529 Male erectile dysfunction, unspecified: Secondary | ICD-10-CM | POA: Diagnosis not present

## 2017-03-14 ENCOUNTER — Encounter: Payer: Self-pay | Admitting: Internal Medicine

## 2022-12-24 DEATH — deceased
